# Patient Record
Sex: Female | Born: 1960
Health system: Southern US, Community
[De-identification: ages and names within clinical notes are randomized; demographics above are authoritative.]

## PROBLEM LIST (undated history)

## (undated) DIAGNOSIS — R32 Unspecified urinary incontinence: Secondary | ICD-10-CM

## (undated) DIAGNOSIS — R112 Nausea with vomiting, unspecified: Secondary | ICD-10-CM

## (undated) DIAGNOSIS — M1990 Unspecified osteoarthritis, unspecified site: Secondary | ICD-10-CM

## (undated) DIAGNOSIS — Z8042 Family history of malignant neoplasm of prostate: Secondary | ICD-10-CM

## (undated) DIAGNOSIS — G43909 Migraine, unspecified, not intractable, without status migrainosus: Secondary | ICD-10-CM

## (undated) DIAGNOSIS — D251 Intramural leiomyoma of uterus: Secondary | ICD-10-CM

## (undated) DIAGNOSIS — N939 Abnormal uterine and vaginal bleeding, unspecified: Secondary | ICD-10-CM

## (undated) DIAGNOSIS — E039 Hypothyroidism, unspecified: Secondary | ICD-10-CM

## (undated) DIAGNOSIS — Z803 Family history of malignant neoplasm of breast: Secondary | ICD-10-CM

## (undated) DIAGNOSIS — N840 Polyp of corpus uteri: Secondary | ICD-10-CM

## (undated) DIAGNOSIS — Z9889 Other specified postprocedural states: Secondary | ICD-10-CM

## (undated) DIAGNOSIS — Z973 Presence of spectacles and contact lenses: Secondary | ICD-10-CM

## (undated) DIAGNOSIS — M7500 Adhesive capsulitis of unspecified shoulder: Secondary | ICD-10-CM

## (undated) HISTORY — DX: Unspecified urinary incontinence: R32

## (undated) HISTORY — DX: Family history of malignant neoplasm of prostate: Z80.42

## (undated) HISTORY — PX: BREAST ENHANCEMENT SURGERY: SHX7

## (undated) HISTORY — PX: NASAL SEPTUM SURGERY: SHX37

## (undated) HISTORY — DX: Unspecified osteoarthritis, unspecified site: M19.90

## (undated) HISTORY — PX: WISDOM TOOTH EXTRACTION: SHX21

## (undated) HISTORY — DX: Adhesive capsulitis of unspecified shoulder: M75.00

## (undated) HISTORY — PX: FACIAL COSMETIC SURGERY: SHX629

## (undated) HISTORY — DX: Family history of malignant neoplasm of breast: Z80.3

## (undated) HISTORY — DX: Intramural leiomyoma of uterus: D25.1

## (undated) HISTORY — DX: Hypothyroidism, unspecified: E03.9

---

## 1999-05-15 ENCOUNTER — Other Ambulatory Visit: Admission: RE | Admit: 1999-05-15 | Discharge: 1999-05-15 | Payer: Self-pay | Admitting: Obstetrics & Gynecology

## 2000-06-02 ENCOUNTER — Other Ambulatory Visit: Admission: RE | Admit: 2000-06-02 | Discharge: 2000-06-02 | Payer: Self-pay | Admitting: Obstetrics and Gynecology

## 2001-06-03 ENCOUNTER — Other Ambulatory Visit: Admission: RE | Admit: 2001-06-03 | Discharge: 2001-06-03 | Payer: Self-pay | Admitting: Obstetrics and Gynecology

## 2001-11-17 ENCOUNTER — Other Ambulatory Visit: Admission: RE | Admit: 2001-11-17 | Discharge: 2001-11-17 | Payer: Self-pay | Admitting: Obstetrics and Gynecology

## 2002-06-22 ENCOUNTER — Other Ambulatory Visit: Admission: RE | Admit: 2002-06-22 | Discharge: 2002-06-22 | Payer: Self-pay | Admitting: Obstetrics and Gynecology

## 2003-10-04 ENCOUNTER — Other Ambulatory Visit: Admission: RE | Admit: 2003-10-04 | Discharge: 2003-10-04 | Payer: Self-pay | Admitting: Obstetrics and Gynecology

## 2005-03-20 ENCOUNTER — Other Ambulatory Visit: Admission: RE | Admit: 2005-03-20 | Discharge: 2005-03-20 | Payer: Self-pay | Admitting: Obstetrics and Gynecology

## 2006-08-29 ENCOUNTER — Other Ambulatory Visit: Admission: RE | Admit: 2006-08-29 | Discharge: 2006-08-29 | Payer: Self-pay | Admitting: Obstetrics and Gynecology

## 2007-09-04 ENCOUNTER — Other Ambulatory Visit: Admission: RE | Admit: 2007-09-04 | Discharge: 2007-09-04 | Payer: Self-pay | Admitting: Obstetrics and Gynecology

## 2007-10-26 ENCOUNTER — Other Ambulatory Visit: Admission: RE | Admit: 2007-10-26 | Discharge: 2007-10-26 | Payer: Self-pay | Admitting: Obstetrics and Gynecology

## 2008-02-24 ENCOUNTER — Other Ambulatory Visit: Admission: RE | Admit: 2008-02-24 | Discharge: 2008-02-24 | Payer: Self-pay | Admitting: Obstetrics and Gynecology

## 2008-05-31 ENCOUNTER — Other Ambulatory Visit: Admission: RE | Admit: 2008-05-31 | Discharge: 2008-05-31 | Payer: Self-pay | Admitting: Obstetrics and Gynecology

## 2011-01-25 LAB — HM PAP SMEAR

## 2011-02-07 ENCOUNTER — Encounter: Payer: Self-pay | Admitting: Internal Medicine

## 2011-03-13 ENCOUNTER — Ambulatory Visit: Payer: Self-pay | Admitting: Internal Medicine

## 2011-07-08 ENCOUNTER — Encounter: Payer: Self-pay | Admitting: Internal Medicine

## 2011-09-03 ENCOUNTER — Ambulatory Visit: Payer: Self-pay | Admitting: Internal Medicine

## 2011-09-16 ENCOUNTER — Telehealth: Payer: Self-pay | Admitting: Internal Medicine

## 2011-09-16 NOTE — Telephone Encounter (Signed)
Message copied by Arna Snipe on Mon Sep 16, 2011  8:51 AM ------      Message from: Richardson Chiquito      Created: Tue Sep 03, 2011  2:41 PM                   ----- Message -----         From: Hart Carwin, MD         Sent: 09/03/2011   2:38 PM           To: Richardson Chiquito, CMA            Please charge no show.      ----- Message -----         From: Richardson Chiquito, CMA         Sent: 09/03/2011   2:08 PM           To: Hart Carwin, MD            Dr Juanda Chance-      Patient no showed appointment with you on 09/03/11. Do you want to charge no show fee?

## 2011-09-18 ENCOUNTER — Encounter: Payer: Self-pay | Admitting: Internal Medicine

## 2011-11-26 ENCOUNTER — Other Ambulatory Visit: Payer: Managed Care, Other (non HMO)

## 2011-11-26 ENCOUNTER — Ambulatory Visit (INDEPENDENT_AMBULATORY_CARE_PROVIDER_SITE_OTHER): Payer: Managed Care, Other (non HMO) | Admitting: Internal Medicine

## 2011-11-26 ENCOUNTER — Encounter: Payer: Self-pay | Admitting: Internal Medicine

## 2011-11-26 VITALS — BP 100/60 | HR 60 | Ht 63.5 in | Wt 121.8 lb

## 2011-11-26 DIAGNOSIS — K5900 Constipation, unspecified: Secondary | ICD-10-CM

## 2011-11-26 DIAGNOSIS — R140 Abdominal distension (gaseous): Secondary | ICD-10-CM

## 2011-11-26 DIAGNOSIS — R141 Gas pain: Secondary | ICD-10-CM

## 2011-11-26 DIAGNOSIS — R143 Flatulence: Secondary | ICD-10-CM

## 2011-11-26 MED ORDER — POLYETHYLENE GLYCOL 3350 17 GM/SCOOP PO POWD: ORAL | Status: DC

## 2011-11-26 NOTE — Progress Notes (Signed)
Anna Young 08-02-1960 MRN [id number]        History of Present Illness:  This is a 51 year old white female with bloating, gas and constipation. She at one point did not have a bowel movement for 2 weeks. She used to be regular until she was diagnosed with the thyroid problems about 4 years ago. She is now on thyroid replacement. She is taking magnesium oxide and probiotics without much improvement in her bowel habits. He denies rectal bleeding. There is no family history of colon cancer or inflammatory bowel disease her diet does not consist of high fiber. She prefers protein. Her job is sedentary. Her weight has increased about 5 pounds . There is a positive family history of gallbladder disease in her mother who had a gallbladder removed   Past Medical History  Diagnosis Date  . Hypothyroidism    Past Surgical History  Procedure Date  . Mouth surgery     wisdom teeth  . Nasal septum surgery     reports that she has never smoked. She has never used smokeless tobacco. She reports that she drinks alcohol. She reports that she does not use illicit drugs. family history includes Colon polyps in her father and Diabetes in her mother.  There is no history of Colon cancer. No Known Allergies      Review of Systems: Denies heartburn dyspepsia dysphagia chest pain  The remainder of the 10 point ROS is negative except as outlined in H&P   Physical Exam: General appearance  Well developed, in no distress. Eyes- non icteric. HEENT nontraumatic, normocephalic. Mouth no lesions, tongue papillated, no cheilosis. Neck supple without adenopathy, thyroid not enlarged, no carotid bruits, no JVD. Lungs Clear to auscultation bilaterally. Cor normal S1, normal S2, regular rhythm, no murmur,  quiet precordium. Abdomen: Soft with increased tympany. Normoactive bowel sounds. No tenderness. Liver edge at costal margin. Lower abdomen unremarkable Rectal: Small amount of Hemoccult negative  stool Extremities no pedal edema. Skin no lesions. Neurological alert and oriented x 3. Psychological normal mood and affect.  Assessment and Plan:  Problem #1 Suspect chronic functional constipation due to slow transit time. We need to rule out redundant colon or partial colon obstruction. She is Hemoccult-negative. There has been no weight loss. We will need to increase the fiber intake in her diet and she should become more physically active to stimulate her peristalsis. She is euthyroid at this time. I will start her on MiraLax 17 g daily and she may be able to titrate the dose. We will also proceed with a colonoscopy because she is turning 51 this year and she will use the routine moviprep.   Problem #2 Positive family history of gallbladder disease. She has a lot of bloating and food intolerance. We will go ahead with an upper abdominal ultrasound.  11/26/2011 Lina Sar

## 2011-11-26 NOTE — Patient Instructions (Addendum)
You have been scheduled for an abdominal ultrasound at [facility] (1st floor of hospital) on 12/02/11 at 8:30 am. Please arrive 15 minutes prior to your appointment for registration. Make certain not to have anything to eat or drink 6 hours prior to your appointment. Should you need to reschedule your appointment, please contact radiology at [phone]. You have been scheduled for a colonoscopy with propofol. Please follow written instructions given to you at your visit today.  Please pick up your prep kit at the pharmacy within the next 1-3 days. If you use inhalers (even only as needed), please bring them with you on the day of your procedure. Your physician has requested that you go to the basement for the following lab work before leaving today: Celiac 10 panel We have sent the following medications to your pharmacy for you to pick up at your convenience: Miralax CC: Dr Bea Laura. Alessandra Bevels High Fiber Diet A high fiber diet changes your normal diet to include more whole grains, legumes, fruits, and vegetables. Changes in the diet involve replacing refined carbohydrates with unrefined foods. The calorie level of the diet is essentially unchanged. The Dietary Reference Intake (recommended amount) for adult males is 38 g per day. For adult females, it is 25 g per day. Pregnant and lactating women should consume 28 g of fiber per day. Fiber is the intact part of a plant that is not broken down during digestion. Functional fiber is fiber that has been isolated from the plant to provide a beneficial effect in the body. PURPOSE  Increase stool bulk.   Ease and regulate bowel movements.   Lower cholesterol.  INDICATIONS THAT YOU NEED MORE FIBER  Constipation and hemorrhoids.   Uncomplicated diverticulosis (intestine condition) and irritable bowel syndrome.   Weight management.   As a protective measure against hardening of the arteries (atherosclerosis), diabetes, and cancer.  NOTE OF  CAUTION If you have a digestive or bowel problem, ask your caregiver for advice before adding high fiber foods to your diet. Some of the following medical problems are such that a high fiber diet should not be used without consulting your caregiver:  Acute diverticulitis (intestine infection).   Partial small bowel obstructions.   Complicated diverticular disease involving bleeding, rupture (perforation), or abscess (boil, furuncle).   Presence of autonomic neuropathy (nerve damage) or gastric paresis (stomach cannot empty itself).  GUIDELINES FOR INCREASING FIBER  Start adding fiber to the diet slowly. A gradual increase of about 5 more grams (2 slices of whole-wheat bread, 2 servings of most fruits or vegetables, or 1 bowl of high fiber cereal) per day is best. Too rapid an increase in fiber may result in constipation, flatulence, and bloating.   Drink enough water and fluids to keep your urine clear or pale yellow. Water, juice, or caffeine-free drinks are recommended. Not drinking enough fluid may cause constipation.   Eat a variety of high fiber foods rather than one type of fiber.   Try to increase your intake of fiber through using high fiber foods rather than fiber pills or supplements that contain small amounts of fiber.   The goal is to change the types of food eaten. Do not supplement your present diet with high fiber foods, but replace foods in your present diet.  INCLUDE A VARIETY OF FIBER SOURCES  Replace refined and processed grains with whole grains, canned fruits with fresh fruits, and incorporate other fiber sources. White rice, white breads, and most bakery goods contain little or  no fiber.   Brown whole-grain rice, buckwheat oats, and many fruits and vegetables are all good sources of fiber. These include: broccoli, Brussels sprouts, cabbage, cauliflower, beets, sweet potatoes, white potatoes (skin on), carrots, tomatoes, eggplant, squash, berries, fresh fruits, and dried  fruits.   Cereals appear to be the richest source of fiber. Cereal fiber is found in whole grains and bran. Bran is the fiber-rich outer coat of cereal grain, which is largely removed in refining. In whole-grain cereals, the bran remains. In breakfast cereals, the largest amount of fiber is found in those with "bran" in their names. The fiber content is sometimes indicated on the label.   You may need to include additional fruits and vegetables each day.   In baking, for 1 cup white flour, you may use the following substitutions:   1 cup whole-wheat flour minus 2 tbs.    cup white flour plus  cup whole-wheat flour.  Document Released: 04/29/2005 Document Revised: 04/18/2011 Document Reviewed: 03/07/2009 [employer] Patient Information 2012 [employer], [place]. ______________________________________________________________________________________  Constipation in Adults Constipation is having fewer than 2 bowel movements per week. Usually, the stools are hard. As we grow older, constipation is more common. If you try to fix constipation with laxatives, the problem may get worse. This is because laxatives taken over a long period of time make the colon muscles weaker. A low-fiber diet, not taking in enough fluids, and taking some medicines may make these problems worse. MEDICATIONS THAT MAY CAUSE CONSTIPATION  Water pills (diuretics).   Calcium channel blockers (used to control blood pressure and for the heart).   Certain pain medicines (narcotics).   Anticholinergics.   Anti-inflammatory agents.   Antacids that contain aluminum.  DISEASES THAT CONTRIBUTE TO CONSTIPATION  Diabetes.   Parkinson's disease.   Dementia.   Stroke.   Depression.   Illnesses that cause problems with salt and water metabolism.  HOME CARE INSTRUCTIONS   Constipation is usually best cared for without medicines. Increasing dietary fiber and eating more fruits and vegetables is the best way to manage  constipation.   Slowly increase fiber intake to 25 to 38 grams per day. Whole grains, fruits, vegetables, and legumes are good sources of fiber. A dietitian can further help you incorporate high-fiber foods into your diet.   Drink enough water and fluids to keep your urine clear or pale yellow.   A fiber supplement may be added to your diet if you cannot get enough fiber from foods.   Increasing your activities also helps improve regularity.   Suppositories, as suggested by your caregiver, will also help. If you are using antacids, such as aluminum or calcium containing products, it will be helpful to switch to products containing magnesium if your caregiver says it is okay.   If you have been given a liquid injection (enema) today, this is only a temporary measure. It should not be relied on for treatment of longstanding (chronic) constipation.   Stronger measures, such as magnesium sulfate, should be avoided if possible. This may cause uncontrollable diarrhea. Using magnesium sulfate may not allow you time to make it to the bathroom.  SEEK IMMEDIATE MEDICAL CARE IF:   There is bright red blood in the stool.   The constipation stays for more than 4 days.   There is belly (abdominal) or rectal pain.   You do not seem to be getting better.   You have any questions or concerns.  MAKE SURE YOU:   Understand these instructions.  Will watch your condition.   Will get help right away if you are not doing well or get worse.  Document Released: 01/26/2004 Document Revised: 04/18/2011 Document Reviewed: 04/02/2011 [facility] Patient Information 2012 [place], [place].

## 2011-11-27 LAB — CELIAC PANEL 10
Endomysial Screen: NEGATIVE
Gliadin IgG: 3.7 U/mL (ref <20)
Tissue Transglutaminase Ab, IgA: 3.2 U/mL (ref <20)

## 2011-11-28 ENCOUNTER — Telehealth: Payer: Self-pay | Admitting: Internal Medicine

## 2011-11-28 ENCOUNTER — Telehealth: Payer: Self-pay | Admitting: *Deleted

## 2011-11-28 NOTE — Telephone Encounter (Signed)
Spoke with patient and she had no prep at [facility].   I called the prep in by VM at pharmacy since they are closed at this time.    Patient is aware that it was called in and thanked me.

## 2011-11-29 ENCOUNTER — Encounter: Payer: Self-pay | Admitting: Internal Medicine

## 2011-11-29 ENCOUNTER — Ambulatory Visit (AMBULATORY_SURGERY_CENTER): Payer: Managed Care, Other (non HMO) | Admitting: Internal Medicine

## 2011-11-29 VITALS — BP 93/51 | HR 71 | Temp 97.7°F | Resp 20 | Ht 63.5 in | Wt 121.0 lb

## 2011-11-29 DIAGNOSIS — Z1211 Encounter for screening for malignant neoplasm of colon: Secondary | ICD-10-CM

## 2011-11-29 DIAGNOSIS — D126 Benign neoplasm of colon, unspecified: Secondary | ICD-10-CM

## 2011-11-29 DIAGNOSIS — R141 Gas pain: Secondary | ICD-10-CM

## 2011-11-29 MED ORDER — SODIUM CHLORIDE 0.9 % IV SOLN: 500.0000 mL | INTRAVENOUS | Status: DC

## 2011-11-29 MED ORDER — HYOSCYAMINE SULFATE 0.125 MG SL SUBL: 0.1250 mg | SUBLINGUAL_TABLET | SUBLINGUAL | Status: DC | PRN

## 2011-11-29 NOTE — Progress Notes (Signed)
Patient did not experience any of the following events: a burn prior to discharge; a fall within the facility; wrong site/side/patient/procedure/implant event; or a hospital transfer or hospital admission upon discharge from the facility. (G8907) Patient did not have preoperative order for IV antibiotic SSI prophylaxis. (G8918)  

## 2011-11-29 NOTE — Op Note (Signed)
[facility] [address] [place], [place]  [address]  COLONOSCOPY PROCEDURE REPORT  PATIENT:  Anna Young, Anna Young  MR#:  [id number] BIRTHDATE:  [birth year], 50 yrs. old  GENDER:  female ENDOSCOPIST:  Hedwig Morton. Juanda Chance, MD REF. BY:  Mia Creek, M.D. PROCEDURE DATE:  11/29/2011 PROCEDURE:  Colonoscopy with snare polypectomy ASA CLASS:  Class I INDICATIONS:  colorectal cancer screening, average risk, change in bowel habits MEDICATIONS:   MAC sedation, administered by CRNA, propofol (Diprivan) 200 mg  DESCRIPTION OF PROCEDURE:   After the risks and benefits and of the procedure were explained, informed consent was obtained. Digital rectal exam was performed and revealed no rectal masses. The LB [id number] endoscope was introduced through the anus and advanced to the cecum, which was identified by both the appendix and ileocecal valve.  The quality of the prep was good, using MoviPrep.  The instrument was then slowly withdrawn as the colon was fully examined. <<PROCEDUREIMAGES>>  FINDINGS:  A sessile polyp was found. 6 mm sessile polyp at 20 cm Polyp was snared without cautery. Retrieval was successful (see image5 and image4). snare polyp  This was otherwise a normal examination of the colon (see image6, image3, image2, and image1). Retroflexed views in the rectum revealed no abnormalities.    The scope was then withdrawn from the patient and the procedure completed.  COMPLICATIONS:  None ENDOSCOPIC IMPRESSION: 1) Sessile polyp 2) Otherwise normal examination RECOMMENDATIONS: 1) Await pathology results 2) High fiber diet. abdominal ultrasound pending  REPEAT EXAM:  In 10 year(s) for.  if polyp is adenomatous,then 5 year recall  ______________________________ Hedwig Morton. Juanda Chance, MD  CC:  n. eSIGNED:   Hedwig Morton. Anmol Paschen at 11/29/2011 01:02 PM  Gigi Gin, [phone]

## 2011-11-29 NOTE — Patient Instructions (Addendum)
YOU HAD AN ENDOSCOPIC PROCEDURE TODAY AT THE [facility]: Refer to the procedure report that was given to you for any specific questions about what was found during the examination.  If the procedure report does not answer your questions, please call your gastroenterologist to clarify.  If you requested that your care partner not be given the details of your procedure findings, then the procedure report has been included in a sealed envelope for you to review at your convenience later.  YOU SHOULD EXPECT: Some feelings of bloating in the abdomen. Passage of more gas than usual.  Walking can help get rid of the air that was put into your GI tract during the procedure and reduce the bloating. If you had a lower endoscopy (such as a colonoscopy or flexible sigmoidoscopy) you may notice spotting of blood in your stool or on the toilet paper. If you underwent a bowel prep for your procedure, then you may not have a normal bowel movement for a few days.  DIET: Your first meal following the procedure should be a light meal and then it is ok to progress to your normal diet.  A half-sandwich or bowl of soup is an example of a good first meal.  Heavy or fried foods are harder to digest and may make you feel nauseous or bloated.  Likewise meals heavy in dairy and vegetables can cause extra gas to form and this can also increase the bloating.  Drink plenty of fluids but you should avoid alcoholic beverages for 24 hours.  ACTIVITY: Your care partner should take you home directly after the procedure.  You should plan to take it easy, moving slowly for the rest of the day.  You can resume normal activity the day after the procedure however you should NOT DRIVE or use heavy machinery for 24 hours (because of the sedation medicines used during the test).    SYMPTOMS TO REPORT IMMEDIATELY: A gastroenterologist can be reached at any hour.  During normal business hours, 8:30 AM to 5:00 PM Monday through Friday,  call [phone].  After hours and on weekends, please call the GI answering service at [phone] who will take a message and have the physician on call contact you.   Following lower endoscopy (colonoscopy or flexible sigmoidoscopy):  Excessive amounts of blood in the stool  Significant tenderness or worsening of abdominal pains  Swelling of the abdomen that is new, acute  Fever of 100F or higher   FOLLOW UP: If any biopsies were taken you will be contacted by phone or by letter within the next 1-3 weeks.  Call your gastroenterologist if you have not heard about the biopsies in 3 weeks.  Our staff will call the home number listed on your records the next business day following your procedure to check on you and address any questions or concerns that you may have at that time regarding the information given to you following your procedure. This is a courtesy call and so if there is no answer at the home number and we have not heard from you through the emergency physician on call, we will assume that you have returned to your regular daily activities without incident.  SIGNATURES/CONFIDENTIALITY: You and/or your care partner have signed paperwork which will be entered into your electronic medical record.  These signatures attest to the fact that that the information above on your After Visit Summary has been reviewed and is understood.  Full responsibility of the confidentiality of   this discharge information lies with you and/or your care-partner.   Resume medications. Information given on polyps and high fiber diet with discharge instructions. 

## 2011-12-02 ENCOUNTER — Telehealth: Payer: Self-pay | Admitting: *Deleted

## 2011-12-02 ENCOUNTER — Ambulatory Visit (HOSPITAL_COMMUNITY)
Admission: RE | Admit: 2011-12-02 | Discharge: 2011-12-02 | Disposition: A | Discharge status: Home / Self Care | Payer: Managed Care, Other (non HMO) | Source: Ambulatory Visit | Attending: Internal Medicine | Admitting: Internal Medicine

## 2011-12-02 DIAGNOSIS — R141 Gas pain: Secondary | ICD-10-CM | POA: Insufficient documentation

## 2011-12-02 DIAGNOSIS — R109 Unspecified abdominal pain: Secondary | ICD-10-CM | POA: Insufficient documentation

## 2011-12-02 DIAGNOSIS — R143 Flatulence: Secondary | ICD-10-CM | POA: Insufficient documentation

## 2011-12-02 DIAGNOSIS — R140 Abdominal distension (gaseous): Secondary | ICD-10-CM

## 2011-12-02 DIAGNOSIS — R142 Eructation: Secondary | ICD-10-CM | POA: Insufficient documentation

## 2011-12-02 NOTE — Telephone Encounter (Signed)
  Follow up Call-  Call back number 11/29/2011  Post procedure Call Back phone  # [phone]  Permission to leave phone message Yes     Patient questions:  Do you have a fever, pain , or abdominal swelling? no Pain Score  0 *  Have you tolerated food without any problems? yes  Have you been able to return to your normal activities? yes  Do you have any questions about your discharge instructions: Diet   no Medications  no Follow up visit  no  Do you have questions or concerns about your Care? no  Actions: * If pain score is 4 or above: No action needed, pain <4.

## 2011-12-03 ENCOUNTER — Encounter: Payer: Self-pay | Admitting: Internal Medicine

## 2011-12-06 LAB — HM MAMMOGRAPHY

## 2011-12-12 ENCOUNTER — Other Ambulatory Visit: Payer: Self-pay | Admitting: Internal Medicine

## 2011-12-12 MED ORDER — LUBIPROSTONE 24 MCG PO CAPS: 24.0000 ug | ORAL_CAPSULE | Freq: Every day | ORAL | Status: DC

## 2011-12-12 NOTE — Telephone Encounter (Signed)
Dr Juanda Chance- (please see note below).... Pt prefers a "pill" to take for constipation instead of Miralax powder. Would you like me to send her Amitiza or Linzess?

## 2011-12-12 NOTE — Telephone Encounter (Signed)
Would try Amitiza 24ug po bid, #30, if too expensive, purchase OTC Mad Oxide 500mg  2 po qd and/or stool softener which may be taken in addition to the laxative if necessary.

## 2011-12-12 NOTE — Telephone Encounter (Signed)
Patient verbalizes understanding of Dr Regino Schultze recommendations.

## 2011-12-26 NOTE — Telephone Encounter (Signed)
See previous message

## 2012-02-01 ENCOUNTER — Other Ambulatory Visit: Payer: Self-pay | Admitting: Internal Medicine

## 2012-05-13 DIAGNOSIS — D251 Intramural leiomyoma of uterus: Secondary | ICD-10-CM

## 2012-05-13 HISTORY — DX: Intramural leiomyoma of uterus: D25.1

## 2013-02-10 ENCOUNTER — Ambulatory Visit: Payer: Self-pay | Admitting: Obstetrics and Gynecology

## 2013-02-10 ENCOUNTER — Ambulatory Visit (INDEPENDENT_AMBULATORY_CARE_PROVIDER_SITE_OTHER): Payer: Managed Care, Other (non HMO) | Admitting: Obstetrics and Gynecology

## 2013-02-10 ENCOUNTER — Telehealth: Payer: Self-pay | Admitting: Obstetrics and Gynecology

## 2013-02-10 ENCOUNTER — Encounter: Payer: Self-pay | Admitting: Obstetrics and Gynecology

## 2013-02-10 VITALS — BP 130/64 | HR 76 | Ht 63.0 in | Wt 114.0 lb

## 2013-02-10 DIAGNOSIS — Z01419 Encounter for gynecological examination (general) (routine) without abnormal findings: Secondary | ICD-10-CM

## 2013-02-10 DIAGNOSIS — D259 Leiomyoma of uterus, unspecified: Secondary | ICD-10-CM

## 2013-02-10 DIAGNOSIS — K5900 Constipation, unspecified: Secondary | ICD-10-CM

## 2013-02-10 DIAGNOSIS — R32 Unspecified urinary incontinence: Secondary | ICD-10-CM

## 2013-02-10 DIAGNOSIS — D219 Benign neoplasm of connective and other soft tissue, unspecified: Secondary | ICD-10-CM | POA: Insufficient documentation

## 2013-02-10 DIAGNOSIS — N3946 Mixed incontinence: Secondary | ICD-10-CM | POA: Insufficient documentation

## 2013-02-10 NOTE — Telephone Encounter (Signed)
LMTCB to discuss ins benefits and schedule PUS.  °

## 2013-02-10 NOTE — Patient Instructions (Signed)
EXERCISE AND DIET:  We recommended that you start or continue a regular exercise program for good health. Regular exercise means any activity that makes your heart beat faster and makes you sweat.  We recommend exercising at least 30 minutes per day at least 3 days a week, preferably 4 or 5.  We also recommend a diet low in fat and sugar.  Inactivity, poor dietary choices and obesity can cause diabetes, heart attack, stroke, and kidney damage, among others.    ALCOHOL AND SMOKING:  Women should limit their alcohol intake to no more than 7 drinks/beers/glasses of wine (combined, not each!) per week. Moderation of alcohol intake to this level decreases your risk of breast cancer and liver damage. And of course, no recreational drugs are part of a healthy lifestyle.  And absolutely no smoking or even second hand smoke. Most people know smoking can cause heart and lung diseases, but did you know it also contributes to weakening of your bones? Aging of your skin?  Yellowing of your teeth and nails?  CALCIUM AND VITAMIN D:  Adequate intake of calcium and Vitamin D are recommended.  The recommendations for exact amounts of these supplements seem to change often, but generally speaking 600 mg of calcium (either carbonate or citrate) and 800 units of Vitamin D per day seems prudent. Certain women may benefit from higher intake of Vitamin D.  If you are among these women, your doctor will have told you during your visit.    PAP SMEARS:  Pap smears, to check for cervical cancer or precancers,  have traditionally been done yearly, although recent scientific advances have shown that most women can have pap smears less often.  However, every woman still should have a physical exam from her gynecologist every year. It will include a breast check, inspection of the vulva and vagina to check for abnormal growths or skin changes, a visual exam of the cervix, and then an exam to evaluate the size and shape of the uterus and  ovaries.  And after 51 years of age, a rectal exam is indicated to check for rectal cancers. We will also provide age appropriate advice regarding health maintenance, like when you should have certain vaccines, screening for sexually transmitted diseases, bone density testing, colonoscopy, mammograms, etc.   MAMMOGRAMS:  All women over 40 years old should have a yearly mammogram. Many facilities now offer a "3D" mammogram, which may cost around $50 extra out of pocket. If possible,  we recommend you accept the option to have the 3D mammogram performed.  It both reduces the number of women who will be called back for extra views which then turn out to be normal, and it is better than the routine mammogram at detecting truly abnormal areas.    COLONOSCOPY:  Colonoscopy to screen for colon cancer is recommended for all women at age 50.  We know, you hate the idea of the prep.  We agree, BUT, having colon cancer and not knowing it is worse!!  Colon cancer so often starts as a polyp that can be seen and removed at colonscopy, which can quite literally save your life!  And if your first colonoscopy is normal and you have no family history of colon cancer, most women don't have to have it again for 10 years.  Once every ten years, you can do something that may end up saving your life, right?  We will be happy to help you get it scheduled when you are ready.    Be sure to check your insurance coverage so you understand how much it will cost.  It may be covered as a preventative service at no cost, but you should check your particular policy.     Progesterone capsules What is this medicine? PROGESTERONE (proe JES ter one) is a female hormone. This medicine is used to prevent the overgrowth of the lining of the uterus in women who are taking estrogens for the symptoms of menopause. It is also used to treat secondary amenorrhea. This is when a woman stops getting menstrual periods due to low levels of  progesterone. This medicine may be used for other purposes; ask your health care provider or pharmacist if you have questions. What should I tell my health care provider before I take this medicine? They need to know if you have any of these conditions: -autoimmune disease like systemic lupus erythematosus (SLE) -blood vessel disease, blood clotting disorder, or suffered a stroke -breast, cervical or vaginal cancer -dementia -diabetes -kidney or liver disease -heart disease, high blood pressure or recent heart attack -high blood lipids or cholesterol -hysterectomy -recent miscarriage -tobacco smoker -vaginal bleeding -an unusual or allergic reaction to progesterone, peanuts, other medicines, foods, dyes, or preservatives -pregnant or trying to get pregnant -breast-feeding How should I use this medicine? Take this medicine by mouth with a glass of water. Follow the directions on the prescription label. Take your doses at regular intervals. Do not take your medicine more often than directed. Talk to your pediatrician regarding the use of this medicine in children. Special care may be needed. A patient package insert for the product will be given with each prescription and refill. Read this sheet carefully each time. The sheet may change frequently. Overdosage: If you think you have taken too much of this medicine contact a poison control center or emergency room at once. NOTE: This medicine is only for you. Do not share this medicine with others. What if I miss a dose? If you miss a dose, take it as soon as you can. If it is almost time for your next dose, take only that dose. Do not take double or extra doses. What may interact with this medicine? Do not take this medicine with any of the following medications: -bosentan This medicine may also interact with the following medications: -barbiturate medicines for sleep or  seizures -bexarotene -carbamazepine -ethotoin -ketoconazole -phenytoin -rifampin This list may not describe all possible interactions. Give your health care provider a list of all the medicines, herbs, non-prescription drugs, or dietary supplements you use. Also tell them if you smoke, drink alcohol, or use illegal drugs. Some items may interact with your medicine. What should I watch for while using this medicine? Visit your doctor or health care professional for regular checks on your progress. This medicine can cause swelling, tenderness, or bleeding of the gums. Be careful when brushing and flossing teeth. See your dentist regularly for routine dental care. You may get drowsy or dizzy. Do not drive, use machinery, or do anything that needs mental alertness until you know how this drug affects you. Do not stand or sit up quickly, especially if you are an older patient. This reduces the risk of dizzy or fainting spells. What side effects may I notice from receiving this medicine? Side effects that you should report to your doctor or health care professional as soon as possible: -allergic reactions like skin rash, itching or hives, swelling of the face, lips, or tongue -breast tissue changes or discharge -  changes in vaginal bleeding during your period or between your periods -depression -muscle or bone pain -numbness or pain in the arm or leg -pain in the chest, groin or leg -seizures or tremors -severe headache -stomach pain -sudden shortness of breath -unusually weak or tired -vision or speech problems -yellowing of skin or eyes Side effects that usually do not require medical attention (report to your doctor or health care professional if they continue or are bothersome): -acne -fluid retention and swelling -increased in appetite -mood changes, anxiety, depression, frustration, anger, or emotional outbursts -nausea, vomiting -sweating or hot flashes This list may not describe  all possible side effects. Call your doctor for medical advice about side effects. You may report side effects to FDA at 1-800-FDA-1088. Where should I keep my medicine? Keep out of the reach of children. Store at room temperature between 15 and 30 degrees C (59 and 86 degrees F). Protect from light. Keep container tightly closed. Throw away any unused medicine after the expiration date. NOTE: This sheet is a summary. It may not cover all possible information. If you have questions about this medicine, talk to your doctor, pharmacist, or health care provider.  2012, [employer]vier/[employer] Standard. (04/14/2008 11:43:48 AM)

## 2013-02-10 NOTE — Progress Notes (Addendum)
Patient ID: Anna Young, female   DOB: April 19, 1961, 52 y.o.   MRN: [id number] GYNECOLOGY VISIT  PCP:  Allena Napoleon, MD  Referring provider:   HPI: 52 y.o.   Married  Caucasian  female   [phone] with Patient's last menstrual period was 01/20/2013.   here for   AEX.  Menses every month, very light.  Menses can obe a week early or late.   crampy day one.  No intermenstrual bleeding.  No hot flashes or night sweats.  Takes progesterone orally daily - through Dr. Alessandra Bevels and [facility].  Not taking estrogen.   Known uterine fibroids on paper chart review.  Had ultrasound on 10/06/07 for AGUS pap and was noted to have three intramural fibroids - 14 x 6 mm, 9 x 11 mm, and 7 mm.  Normal ovaries.   Had some asymmetrical thickening of the endometrium at that time and had an endometrial biopsy showing degenerating secretory endometrium.  Has constipation issues ever since diagnosed with hypothyroidism. Takes Ex-lax once a week. Takes probiotics.    Urinary incontinence with cough and sneeze.  No pad use.  Does some Kegels.  Hgb:  PCP Urine:  none  GYNECOLOGIC HISTORY: Patient's last menstrual period was 01/20/2013. Sexually active:  yes Partner preference: female Contraception:   vasectomy Menopausal hormone therapy: no DES exposure:   no Blood transfusions:   no Sexually transmitted diseases: no   GYN Procedures: AGUS pap 4/09 - Endometrial biopsy 5/09--neg, colposcopy 6/09--CIN I .  Mammogram:  12-06-11 wnl: [facility].  Has bilateral breast augmentation with saline implants.           Pap:  01-25-11 wnl:no HR HPV History of abnormal pap smear:  4/09 atypical glandular cells--negative endometrial bx and CIN I on colposcopy.  Paps normal since.   OB History   Grav Para Term Preterm Abortions TAB SAB Ect Mult Living   3 3 3       3        LIFESTYLE: Exercise:   no         Tobacco:   no Alcohol:      socially Drug use:   no  OTHER HEALTH MAINTENANCE: Tetanus/TDap:   PCP Gardisil:  NA Influenza:   never Zostavax:  NA  Bone density:   never Colonoscopy:   11/2011 Dr. Verlee Monte Brodie:1 polyp.  Next colonoscopy 11/2016.  Cholesterol check:  wnl with PCP  Family History  Problem Relation Age of Onset  . Colon polyps Father   . Diabetes Mother     diet controlled  . Colon cancer Neg Hx   . Stroke Maternal Grandfather     There are no active problems to display for this patient.  Past Medical History  Diagnosis Date  . Hypothyroidism   . Urinary incontinence     especially with coughing and sneezing    Past Surgical History  Procedure Laterality Date  . Mouth surgery      wisdom teeth  . Nasal septum surgery    . Breast enhancement surgery    . Facial cosmetic surgery      ALLERGIES: Review of patient's allergies indicates no known allergies.  Current Outpatient Prescriptions  Medication Sig Dispense Refill  . levothyroxine (SYNTHROID, LEVOTHROID) 88 MCG tablet Take 88 mcg by mouth daily.      Marland Kitchen liothyronine (CYTOMEL) 5 MCG tablet Take 5 mcg by mouth daily.      . Misc Natural Products (COLON CLEANSE PO) Take 1 capsule by mouth  daily.      . Multiple Vitamin (MULTI-VITAMINS PO) Take by mouth daily.      . NONFORMULARY OR COMPOUNDED ITEM Testosterone and Progesterone compounded at [facility], 1/2 twice a day, disolves in cheek      . OVER THE COUNTER MEDICATION Vitamin D  10,000 every other day      . OVER THE COUNTER MEDICATION OTC enzymes -use as needed      . Probiotic Product (PROBIOTIC PO) Take by mouth daily.      Marland Kitchen VITAMIN A PO Take by mouth daily.       No current facility-administered medications for this visit.     ROS:  Pertinent items are noted in HPI.  SOCIAL HISTORY:  Realtor.  Daughter marrying this weekend.   PHYSICAL EXAMINATION:    BP 130/64  Pulse 76  Ht 5\' 3"  (1.6 m)  Wt 114 lb (51.71 kg)  BMI 20.2 kg/m2  LMP 01/20/2013   Wt Readings from Last 3 Encounters:  02/10/13 114 lb (51.71 kg)  11/29/11  121 lb (54.885 kg)  11/26/11 121 lb 12.8 oz (55.248 kg)     Ht Readings from Last 3 Encounters:  02/10/13 5\' 3"  (1.6 m)  11/29/11 5' 3.5" (1.613 m)  11/26/11 5' 3.5" (1.613 m)    General appearance: alert, cooperative and appears stated age Head: Normocephalic, without obvious abnormality, atraumatic Neck: no adenopathy, supple, symmetrical, trachea midline and thyroid not enlarged, symmetric, no tenderness/mass/nodules Lungs: clear to auscultation bilaterally Breasts: Inspection negative, No nipple retraction or dimpling, No nipple discharge or bleeding, No axillary or supraclavicular adenopathy, Normal to palpation without dominant masses Heart: regular rate and rhythm Abdomen: soft, non-tender; no masses,  no organomegaly Extremities: extremities normal, atraumatic, no cyanosis or edema Skin: Skin color, texture, turgor normal. No rashes or lesions Lymph nodes: Cervical, supraclavicular, and axillary nodes normal. No abnormal inguinal nodes palpated Neurologic: Grossly normal  Pelvic: External genitalia:  no lesions              Urethra:  normal appearing urethra with no masses, tenderness or lesions              Bartholins and Skenes: normal                 Vagina: normal appearing vagina with normal color and discharge, no lesions.  Good Kegel but not sustained.               Cervix: normal appearance              Pap and high risk HPV testing done: yes.            Bimanual Exam:  Uterus:   Anteverted with right uterine fundal fibroid 2 cm,  nontender                                      Adnexa: normal adnexa in size, nontender and no masses                                      Rectovaginal: Confirms                                      Anus:  normal  sphincter tone, no lesions  ASSESSMENT  Uterine fibroids.  Seem to be larger than previously documented. Stress incontinence.   Chronic constipation. On progesterone supplementation.   PLAN  Mammogram.  Patient will  [facility].  Pap smear and high risk HPV testing Counseled on  Fibroids, urinary incontinence.  ACOG handouts. Return for pelvic ultrasound.  I discussed increasing fiber through dietary changes, Metamucil, increased water, and increased activity to improve constipation. I discussed possible role of progesterone supplementation on constipation and fibroid growth stimulation.    Return annually or prn   An After Visit Summary was printed and given to the patient.

## 2013-02-12 LAB — IPS PAP TEST WITH HPV

## 2013-02-17 ENCOUNTER — Telehealth: Payer: Self-pay | Admitting: Obstetrics and Gynecology

## 2013-02-17 NOTE — Telephone Encounter (Signed)
LMTCB to discuss ins benefits and schedule PUS.  °

## 2013-02-19 NOTE — Telephone Encounter (Signed)
Patient is ready to schedule her PUS appointment.  °

## 2013-02-25 ENCOUNTER — Telehealth: Payer: Self-pay | Admitting: Obstetrics and Gynecology

## 2013-02-25 ENCOUNTER — Other Ambulatory Visit: Payer: Managed Care, Other (non HMO) | Admitting: Obstetrics and Gynecology

## 2013-02-25 ENCOUNTER — Other Ambulatory Visit: Payer: Managed Care, Other (non HMO)

## 2013-02-25 NOTE — Telephone Encounter (Signed)
LVM (both numbers) advising appointment time changed from 10 to 1030. Asked patient to call and confirm she is okay with this.

## 2013-02-25 NOTE — Telephone Encounter (Signed)
Patient called back and confirmed that it was ok.

## 2013-03-04 ENCOUNTER — Ambulatory Visit (INDEPENDENT_AMBULATORY_CARE_PROVIDER_SITE_OTHER): Payer: Managed Care, Other (non HMO)

## 2013-03-04 ENCOUNTER — Ambulatory Visit (INDEPENDENT_AMBULATORY_CARE_PROVIDER_SITE_OTHER): Payer: Managed Care, Other (non HMO) | Admitting: Obstetrics and Gynecology

## 2013-03-04 ENCOUNTER — Other Ambulatory Visit: Payer: Managed Care, Other (non HMO)

## 2013-03-04 ENCOUNTER — Encounter: Payer: Self-pay | Admitting: Obstetrics and Gynecology

## 2013-03-04 ENCOUNTER — Other Ambulatory Visit: Payer: Managed Care, Other (non HMO) | Admitting: Obstetrics and Gynecology

## 2013-03-04 VITALS — BP 92/60 | HR 68 | Resp 16 | Ht 63.0 in | Wt 116.0 lb

## 2013-03-04 DIAGNOSIS — D259 Leiomyoma of uterus, unspecified: Secondary | ICD-10-CM

## 2013-03-04 DIAGNOSIS — D251 Intramural leiomyoma of uterus: Secondary | ICD-10-CM

## 2013-03-04 DIAGNOSIS — D219 Benign neoplasm of connective and other soft tissue, unspecified: Secondary | ICD-10-CM

## 2013-03-04 NOTE — Progress Notes (Signed)
Subjective  Patient here for pelvic ultrasound to evaluate fibroids palpated on routine gyn exam this month.  Last ultrasound in 2009 documented 3 intramural fibroids 14 mm, 11 mm, and 7 mm in largest diameters.  Ovaries were normal then.  Patient is taking cyclic progesterone and feels better doing this than not.  Not taking any estrogen or testosterone.  Took testosterone in the past.   Objective  See ultrasound below - 4 intramural fibroids with largest diameters of 1.6 cm, 2.3 cm, 1.1 cm, and 1.3 cm.  EMS 4.32 mm.  Normal right ovary and left corpus luteum cyst 1.9 mm.     Assessment  Uterine fibroids - slight increase in sizes and increase in number from 3 - 4. On cyclic progesterone therapy.  Plan  Observational management. OK to continue with cyclic progesterone. Discussed the life history of fibroids with patient.   Discussed signs and symptoms to watch for - increased bleeding, pressure, urinary urgency. Has mammogram scheduled. Follow up next year for routine annual exam and prn.

## 2013-03-04 NOTE — Patient Instructions (Signed)
Call if you develop signs of increased bleeding, pelvic pressure, or urinary control problems.

## 2014-01-05 ENCOUNTER — Encounter: Payer: Self-pay | Admitting: Obstetrics and Gynecology

## 2014-02-11 ENCOUNTER — Ambulatory Visit: Payer: Managed Care, Other (non HMO) | Admitting: Obstetrics and Gynecology

## 2014-02-11 ENCOUNTER — Encounter: Payer: Self-pay | Admitting: Obstetrics and Gynecology

## 2014-02-11 ENCOUNTER — Ambulatory Visit (INDEPENDENT_AMBULATORY_CARE_PROVIDER_SITE_OTHER): Payer: BC Managed Care – PPO | Admitting: Obstetrics and Gynecology

## 2014-02-11 VITALS — BP 110/66 | HR 76 | Ht 63.25 in | Wt 117.0 lb

## 2014-02-11 DIAGNOSIS — Z0000 Encounter for general adult medical examination without abnormal findings: Secondary | ICD-10-CM

## 2014-02-11 DIAGNOSIS — Z01419 Encounter for gynecological examination (general) (routine) without abnormal findings: Secondary | ICD-10-CM

## 2014-02-11 LAB — POCT URINALYSIS DIPSTICK
BILIRUBIN UA: NEGATIVE
GLUCOSE UA: NEGATIVE
KETONES UA: NEGATIVE
Leukocytes, UA: NEGATIVE
NITRITE UA: NEGATIVE
Protein, UA: NEGATIVE
RBC UA: NEGATIVE
Urobilinogen, UA: NEGATIVE
pH, UA: 7

## 2014-02-11 NOTE — Patient Instructions (Signed)

## 2014-02-11 NOTE — Progress Notes (Signed)
Patient ID: Anna Young, female   DOB: [birth year], 53 y.o.   MRN: [id number] 52 y.o. [id number] MarriedCaucasianF here for annual exam.   No concerns. Menses back to regular again.  Menses can range every 3 weeks or more.  No prolonged bleeding.  No hot flashes. On progesterone only.  No estrogen or testosterone.   Sees Dr. Sharol Roussel for estrogen levels.  Is lactose and gluten free.  Negative for Celiac Disease but feels better being gluten free.   Some leakage of urine when runs or sneezes.  No LMP recorded.      LMP 02/07/14.    Sexually active: Yes.    The current method of family planning is vasectomy.    Exercising: yes, walking every night with new puppy Smoker:  no  Health Maintenance: Pap:  02/10/13, KGU:[id number] HR HPV History of abnormal Pap:  4/09 atypical glandular cells--negative endometrial bx and CIN I on colposcopy. Paps normal since. MMG:  03/10/13, [facility], 3D, Bi-Rads 2: benign findings.  Will schedule.  Colonoscopy:  11/2011 Dr. Sydell Axon Brodie:1 polyp. Next colonoscopy 11/2016. BMD:   never TDaP:  PCP Screening Labs: PCP, Hb today: PCP, Urine today: negative    reports that she has never smoked. She has never used smokeless tobacco. She reports that she drinks about .5 ounces of alcohol per week. She reports that she does not use illicit drugs.  Past Medical History  Diagnosis Date  . Hypothyroidism   . Urinary incontinence     especially with coughing and sneezing    Past Surgical History  Procedure Laterality Date  . Mouth surgery      wisdom teeth  . Nasal septum surgery    . Breast enhancement surgery    . Facial cosmetic surgery      Current Outpatient Prescriptions  Medication Sig Dispense Refill  . levothyroxine (SYNTHROID, LEVOTHROID) 88 MCG tablet Take 88 mcg by mouth daily.      Marland Kitchen liothyronine (CYTOMEL) 5 MCG tablet Take 5 mcg by mouth daily.      . Misc Natural Products (COLON CLEANSE PO) Take 1 capsule by mouth daily.      . Multiple Vitamin  (MULTI-VITAMINS PO) Take by mouth daily.      . NONFORMULARY OR COMPOUNDED ITEM Testosterone and Progesterone compounded at [facility], 1/2 twice a day, disolves in cheek      . OVER THE COUNTER MEDICATION Vitamin D  10,000 every other day      . OVER THE COUNTER MEDICATION OTC enzymes -use as needed      . Probiotic Product (PROBIOTIC PO) Take by mouth daily.      Marland Kitchen VITAMIN A PO Take by mouth daily.       No current facility-administered medications for this visit.    Family History  Problem Relation Age of Onset  . Colon polyps Father   . Diabetes Mother     diet controlled  . Colon cancer Neg Hx   . Stroke Maternal Grandfather     ROS:  Pertinent items are noted in HPI.  Otherwise, a comprehensive ROS was negative.  Exam:   There were no vitals taken for this visit.   BP 110/66, Pulse 76 Weight 117 pounds      Ht Readings from Last 3 Encounters:  03/04/13 5\' 3"  (1.6 m)  02/10/13 5\' 3"  (1.6 m)  11/29/11 5' 3.5" (1.613 m)    General appearance: alert, cooperative and appears stated age Head: Normocephalic, without obvious abnormality, atraumatic  Neck: no adenopathy, supple, symmetrical, trachea midline and thyroid normal to inspection and palpation Lungs: clear to auscultation bilaterally Breasts: normal appearance, no masses or tenderness,  Bilateral augmentation. Heart: regular rate and rhythm Abdomen: soft, non-tender; bowel sounds normal; no masses,  no organomegaly Extremities: extremities normal, atraumatic, no cyanosis or edema Skin: Skin color, texture, turgor normal. No rashes or lesions Lymph nodes: Cervical, supraclavicular, and axillary nodes normal. No abnormal inguinal nodes palpated Neurologic: Grossly normal   Pelvic: External genitalia:  no lesions              Urethra:  normal appearing urethra with no masses, tenderness or lesions              Bartholins and Skenes: normal                 Vagina: normal appearing vagina with normal color  and discharge, no lesions.  Menstrual flow noted.              Cervix: anteverted              Pap taken: Yes.   Bimanual Exam:  Uterus:  normal size, contour, position, consistency, mobility, non-tender              Adnexa: normal adnexa and no mass, fullness, tenderness               Rectovaginal: Confirms               Anus:  normal sphincter tone, no lesions  A:  Well Woman with normal exam History of AGUS with LGSIL on biopsy and negative EMB.  P:   Mammogram - patient will schedule 3D.  pap smear and high risk HPV testing.  Discussed weight bearing exercise. return annually or prn  An After Visit Summary was printed and given to the patient.

## 2014-02-16 LAB — IPS PAP TEST WITH HPV

## 2014-03-14 ENCOUNTER — Encounter: Payer: Self-pay | Admitting: Obstetrics and Gynecology

## 2014-10-12 ENCOUNTER — Telehealth: Payer: Self-pay | Admitting: Obstetrics and Gynecology

## 2014-10-12 NOTE — Telephone Encounter (Signed)
Spoke with patient. Patient states she went for her 3D mammogram and was advised she did not owe anything at the time of the appointment. Now has received a letter stating her 3D mammogram is not covered by her insurance. "I usually pay 50 dollars up front and then that takes care of everything. Now the insurance company is stating they do not cover this at all. I had no idea until I got the letter and do not know how much it will be." Advised we have received a letter as well and will review this with the administrative staff regarding how to proceed. Advised our office currently does not write appeals for mammograms, but this will be reviewed and return call will be made to patient. Patient is agreeable.

## 2014-10-12 NOTE — Telephone Encounter (Signed)
Patient says her 3D MMG has been denied by her insurance. Patient is asking for suggestions to help get this covered. Patient says this was covered last year due to her dense breast..

## 2014-10-12 NOTE — Telephone Encounter (Signed)
Thank you for contacting the patient with this preliminary information.

## 2014-11-04 NOTE — Telephone Encounter (Signed)
Patient would like to discuss the results of her MMG. Patient was told she had calcifications and additional testing would need to be done. Last seen 02/11/14.

## 2014-11-04 NOTE — Telephone Encounter (Signed)
Spoke with patient. Patient had mammogram performed on 04/18/2014. States she was advised to have a follow up mammogram in six months. "I really do not want to go back this month for the mammogram because I do not want all the radiation. The radiologist told me they were likely just calcium deposits and nothing to worry about." Advised per results notes Dr.Silva also recommends repeat mammogram in 6 months which would be due now in June. Patient again states "I do not want to have this done unless it is absolutely necessary. I am concerned about all the radiation." Patient requesting I speak with Dr.Silva regarding recommendations and return call.

## 2014-11-04 NOTE — Telephone Encounter (Signed)
My recommendation is for the patient to have consultation with the the radiologist to discuss follow up mammogram versus biopsy to have a tissue diagnosis of the area.  Thanks.

## 2014-11-07 NOTE — Telephone Encounter (Signed)
Spoke with patient. Advised of message as seen below from Ritchey. Patient is agreeable and verbalizes understanding. Will call to schedule appointment.  Routing to provider for final review. Patient agreeable to disposition. Will close encounter.

## 2014-11-07 NOTE — Telephone Encounter (Signed)
Left message to call Kaitlyn at [phone]. 

## 2015-01-12 ENCOUNTER — Telehealth: Payer: Self-pay | Admitting: *Deleted

## 2015-01-12 NOTE — Telephone Encounter (Signed)
Patient due for Diagnostic Right MMG due to findings of Amorphous Calcification on previous study.  Recommended 6 month follow up with diagnostic mammogram.  Last MMG:  04/15/14  Pt overdue for mammogram recall.  Due June 2016.  Follow up appointment has not been made.    Letter received from [facility] advising follow up appointment has not been made.  I verified this with Leafy Ro at [place] today.  Patient is scheduled for Screening Mammogram on February 28, 2015 which is too early for screening exam.  Patient scheduled this appointment on-line.    Anna Young was notified via phone by our office in 10/12/14 phone note that her follow up imaging was needed.  Patient stated she was concerned about radiation and was advised per Dr. Quincy Simmonds to schedule a consult appointment with the radiologist to discuss.  Patient was to call to schedule this appointment.  Please advise recall.

## 2015-01-13 NOTE — Telephone Encounter (Signed)
I would send a letter at this point to remind pt we don't have the follow up that was recommended and remove from recall.  Will send to Dr. Quincy Simmonds to ensure she agrees with plan.

## 2015-01-15 NOTE — Telephone Encounter (Signed)
I agree with this plan.  Patient has annual exam with me in November 2016.  Cc - Dr. Sabra Heck

## 2015-01-17 NOTE — Telephone Encounter (Signed)
Please write letter so it can be signed.

## 2015-01-18 ENCOUNTER — Encounter: Payer: Self-pay | Admitting: *Deleted

## 2015-01-18 NOTE — Telephone Encounter (Signed)
Letter created and routed to Dr. Sabra Heck.

## 2015-01-20 ENCOUNTER — Encounter: Payer: Self-pay | Admitting: *Deleted

## 2015-01-20 NOTE — Telephone Encounter (Signed)
Letter marked as sent and mailed.  Pt removed from current recall.  Closing encounter.

## 2015-01-26 ENCOUNTER — Telehealth: Payer: Self-pay | Admitting: Obstetrics and Gynecology

## 2015-01-26 NOTE — Telephone Encounter (Signed)
Patient returning call.

## 2015-01-26 NOTE — Telephone Encounter (Signed)
Left message to call Kaitlyn at [phone]. 

## 2015-01-26 NOTE — Telephone Encounter (Signed)
Patient is asking to talk with Dr.Silva's nurse. No further details given, not urgent. Last seen 02/11/14.

## 2015-01-27 NOTE — Telephone Encounter (Signed)
Order faxed and patient notified.  She states she has been having itching of R breast only. She states it is intermittent and has been ongoing for one year. This is the same breast that she will have diagnostic imaging on on Monday.   Advised patient can obtain advised from Dr. Quincy Simmonds regarding R Breast Itching. Possibly will need breast check but will schedule after diagnostic imaging. Advised will call back with message from Dr. Quincy Simmonds. Patient agreeable. She will discuss with radiologist on Monday as well, because itching is all over entire breast when it does occur.

## 2015-01-27 NOTE — Telephone Encounter (Signed)
Ok to proceed with diagnostic mammogram on Monday.  Claritin or Benadryl may help with itching symptoms.   Benadryl sedates.  I am happy to see the patient sooner than November for her annual exam to re-evaluate with her.   Thanks.

## 2015-01-27 NOTE — Telephone Encounter (Signed)
Patient says she need an order for a mammogram faxed to [facility]. Appointment is Monday at 10:00.

## 2015-01-27 NOTE — Telephone Encounter (Signed)
Left Voicemail for pt to call back. 

## 2015-01-27 NOTE — Telephone Encounter (Signed)
Order to Dr.Silva. 

## 2015-02-09 ENCOUNTER — Ambulatory Visit: Payer: Managed Care, Other (non HMO) | Admitting: Family Medicine

## 2015-02-15 ENCOUNTER — Ambulatory Visit: Payer: Managed Care, Other (non HMO) | Admitting: Family Medicine

## 2015-02-15 NOTE — Telephone Encounter (Signed)
Call to patient. Patient has had diagnostic mammogram.  Annual exam moved up to 02-27-15.  Encounter closed.

## 2015-02-27 ENCOUNTER — Ambulatory Visit: Payer: Self-pay | Admitting: Obstetrics and Gynecology

## 2015-03-01 ENCOUNTER — Ambulatory Visit: Payer: BC Managed Care – PPO | Admitting: Obstetrics and Gynecology

## 2015-03-02 ENCOUNTER — Encounter: Payer: Self-pay | Admitting: Obstetrics and Gynecology

## 2015-03-02 ENCOUNTER — Ambulatory Visit (INDEPENDENT_AMBULATORY_CARE_PROVIDER_SITE_OTHER): Payer: BLUE CROSS/BLUE SHIELD | Admitting: Obstetrics and Gynecology

## 2015-03-02 VITALS — BP 110/70 | HR 64 | Resp 16 | Ht 63.0 in | Wt 116.6 lb

## 2015-03-02 DIAGNOSIS — R921 Mammographic calcification found on diagnostic imaging of breast: Secondary | ICD-10-CM

## 2015-03-02 DIAGNOSIS — Z0000 Encounter for general adult medical examination without abnormal findings: Secondary | ICD-10-CM

## 2015-03-02 DIAGNOSIS — N393 Stress incontinence (female) (male): Secondary | ICD-10-CM

## 2015-03-02 DIAGNOSIS — Z01419 Encounter for gynecological examination (general) (routine) without abnormal findings: Secondary | ICD-10-CM

## 2015-03-02 LAB — POCT URINALYSIS DIPSTICK
BILIRUBIN UA: NEGATIVE
Blood, UA: NEGATIVE
GLUCOSE UA: NEGATIVE
KETONES UA: NEGATIVE
Leukocytes, UA: NEGATIVE
Nitrite, UA: NEGATIVE
Protein, UA: NEGATIVE
Urobilinogen, UA: NEGATIVE
pH, UA: 5

## 2015-03-02 NOTE — Progress Notes (Signed)
Patient ID: Cacey Willow, female   DOB: 10-13-1960, 54 y.o.   MRN: [id number] 54 y.o. [id number] Married Caucasian female here for annual exam.    Menses less regular.  Occurring every other month.  Bleeding last for 5 - 6 days.  Maybe a little heavier, changes pad or tampon every 3 - 4 hours.  No significant pain.  No hot flashes.  Not interested in doing mammogram again in March 2017. Concerned about radiation exposure.   Having some neck pain due to an old injury.  Doing physical therapy.   Leaks urine with cough or sneeze and jumping jacks.   Husband diagnosed with testicular cancer a month ago.  PCP:   Adriana Simas, MD Labs with PCP.   Patient's last menstrual period was 02/25/2015 (exact date).          Sexually active: Yes.   husband The current method of family planning is vasectomy.    Exercising: No.   Smoker:  no  Health Maintenance: Pap:  02-11-14 Ascus:Neg HR HPV History of abnormal Pap:  Yes,08/2007 atypical glandular cells--negative endometrial bx and CIN I on colposcopy.  Patient states history of colposcopy and biopsies 1995 with no treatment to cervix--repeat pap smear was normal. MMG:  01-30-15 Diag.Unilateral, Density Cat.D/probable benign calcifications/BiRads 3/rec.6 month f/u of Rt.breast; 04/2014 Unilateral Rt. probale benign calcifications Rt.breast, left breast negative/Rec.f/u 6 months:[facility] Colonoscopy:  11-29-11 polyp with Dr. Delfin Edis. Next due 11/2016. BMD:   n/a  Result  n/a TDaP:  ?PCP Screening Labs:  Hb today: PCP, Urine today: Neg   reports that she has never smoked. She has never used smokeless tobacco. She reports that she does not drink alcohol or use illicit drugs.  Past Medical History  Diagnosis Date  . Hypothyroidism   . Urinary incontinence     especially with coughing and sneezing  . Arthritis     of neck  . Fibroids, intramural 2014    Past Surgical History  Procedure Laterality Date  . Mouth surgery      wisdom teeth   . Nasal septum surgery    . Breast enhancement surgery    . Facial cosmetic surgery      Current Outpatient Prescriptions  Medication Sig Dispense Refill  . Cyanocobalamin (B-12) 5000 MCG CAPS Take 1 capsule by mouth daily.    Marland Kitchen levothyroxine (SYNTHROID, LEVOTHROID) 88 MCG tablet Take 88 mcg by mouth daily.    Marland Kitchen liothyronine (CYTOMEL) 5 MCG tablet Take 5 mcg by mouth 2 (two) times daily with a meal.    . Multiple Vitamin (MULTI-VITAMINS PO) Take by mouth daily.    . Nutritional Supplements (DHEA PO) Take 1 tablet by mouth daily.    Marland Kitchen OVER THE COUNTER MEDICATION Vitamin D  10,000 every other day    . Probiotic Product (PROBIOTIC PO) Take by mouth daily.    Marland Kitchen VITAMIN A PO Take by mouth daily.     No current facility-administered medications for this visit.    Family History  Problem Relation Age of Onset  . Colon polyps Father   . Heart disease Father   . Stroke Father   . Diabetes Mother     diet controlled  . Colon cancer Neg Hx   . Stroke Maternal Grandfather     ROS:  Pertinent items are noted in HPI.  Otherwise, a comprehensive ROS was negative.  Exam:   BP 110/70 mmHg  Pulse 64  Resp 16  Ht 5\' 3"  (1.6 m)  Wt 116 lb 9.6 oz (52.889 kg)  BMI 20.66 kg/m2  LMP 02/25/2015 (Exact Date)    General appearance: alert, cooperative and appears stated age Head: Normocephalic, without obvious abnormality, atraumatic Neck: no adenopathy, supple, symmetrical, trachea midline and thyroid normal to inspection and palpation Lungs: clear to auscultation bilaterally Breasts: normal appearance, no masses or tenderness, Inspection negative, No nipple retraction or dimpling, No nipple discharge or bleeding, No axillary or supraclavicular adenopathy, bilateral implants. Heart: regular rate and rhythm Abdomen: soft, non-tender; bowel sounds normal; no masses,  no organomegaly Extremities: extremities normal, atraumatic, no cyanosis or edema Skin: Skin color, texture, turgor normal. No  rashes or lesions Lymph nodes: Cervical, supraclavicular, and axillary nodes normal. No abnormal inguinal nodes palpated Neurologic: Grossly normal  Pelvic: External genitalia:  no lesions              Urethra:  normal appearing urethra with no masses, tenderness or lesions              Bartholins and Skenes: normal                 Vagina: normal appearing vagina with normal color and discharge, no lesions              Cervix: no lesions              Pap taken: Yes.   Bimanual Exam:  Uterus:  enlarged, 8 weeks size  Irregular due to fibroids.              Adnexa: normal adnexa and no mass, fullness, tenderness              Rectovaginal: Yes.  .  Confirms.              Anus:  normal sphincter tone, no lesions  Chaperone was present for exam.  Assessment:   Well woman visit with normal exam. Hx of prior AGUS and ASCUS paps.  CIN I on colposcopic biopsy.  Calcifications of right breast.  Genuine stress incontinence.  Uterine fibroids.  Asymptomatic.   Plan: Yearly mammogram recommended after age 89.  Recommended self breast exam.  Pap and HR HPV as above. Discussed Calcium, Vitamin D, regular exercise program including cardiovascular and weight bearing exercise. Labs performed.  No..     Refills given on medications.  No..    Discussion regarding fibroids.  Will have patient meet with Dr. Isaiah Blakes regarding the recommendations for follow up mammogram. Refer to Ileana Roup at [facility]. Follow up annually and prn.      After visit summary provided.

## 2015-03-02 NOTE — Patient Instructions (Signed)

## 2015-03-02 NOTE — Progress Notes (Signed)
Patient scheduled for consult appointment with Dr. Guerry Minors at [facility] for 03/15/15 at 0945.

## 2015-03-06 LAB — IPS PAP TEST WITH HPV

## 2015-03-16 ENCOUNTER — Ambulatory Visit: Payer: Self-pay | Admitting: Obstetrics and Gynecology

## 2015-09-04 DIAGNOSIS — M542 Cervicalgia: Secondary | ICD-10-CM | POA: Diagnosis not present

## 2015-09-14 DIAGNOSIS — M542 Cervicalgia: Secondary | ICD-10-CM | POA: Diagnosis not present

## 2015-09-19 DIAGNOSIS — M542 Cervicalgia: Secondary | ICD-10-CM | POA: Diagnosis not present

## 2015-09-19 DIAGNOSIS — M4692 Unspecified inflammatory spondylopathy, cervical region: Secondary | ICD-10-CM | POA: Diagnosis not present

## 2015-09-19 DIAGNOSIS — M50322 Other cervical disc degeneration at C5-C6 level: Secondary | ICD-10-CM | POA: Diagnosis not present

## 2015-09-19 DIAGNOSIS — M50321 Other cervical disc degeneration at C4-C5 level: Secondary | ICD-10-CM | POA: Diagnosis not present

## 2015-09-25 DIAGNOSIS — E559 Vitamin D deficiency, unspecified: Secondary | ICD-10-CM | POA: Diagnosis not present

## 2015-09-25 DIAGNOSIS — N951 Menopausal and female climacteric states: Secondary | ICD-10-CM | POA: Diagnosis not present

## 2015-09-25 DIAGNOSIS — E039 Hypothyroidism, unspecified: Secondary | ICD-10-CM | POA: Diagnosis not present

## 2015-10-06 ENCOUNTER — Ambulatory Visit (INDEPENDENT_AMBULATORY_CARE_PROVIDER_SITE_OTHER): Payer: BLUE CROSS/BLUE SHIELD | Admitting: Family Medicine

## 2015-10-06 ENCOUNTER — Encounter: Payer: Self-pay | Admitting: Family Medicine

## 2015-10-06 VITALS — BP 92/68 | HR 86 | Ht 63.0 in | Wt 114.0 lb

## 2015-10-06 DIAGNOSIS — M9901 Segmental and somatic dysfunction of cervical region: Secondary | ICD-10-CM

## 2015-10-06 DIAGNOSIS — M47812 Spondylosis without myelopathy or radiculopathy, cervical region: Secondary | ICD-10-CM | POA: Insufficient documentation

## 2015-10-06 DIAGNOSIS — M9903 Segmental and somatic dysfunction of lumbar region: Secondary | ICD-10-CM | POA: Diagnosis not present

## 2015-10-06 DIAGNOSIS — M9902 Segmental and somatic dysfunction of thoracic region: Secondary | ICD-10-CM

## 2015-10-06 DIAGNOSIS — M5030 Other cervical disc degeneration, unspecified cervical region: Secondary | ICD-10-CM | POA: Diagnosis not present

## 2015-10-06 DIAGNOSIS — M999 Biomechanical lesion, unspecified: Secondary | ICD-10-CM | POA: Insufficient documentation

## 2015-10-06 MED ORDER — TIZANIDINE HCL 4 MG PO TABS: 4.0000 mg | ORAL_TABLET | Freq: Every evening | ORAL | Status: DC

## 2015-10-06 NOTE — Progress Notes (Signed)
[name] [address], [place] [address] Phone: [phone] Subjective:    PCP: Dr. Sharol Roussel CC:  Neck pain  [id number] Anna Young is a 55 y.o. female coming in with complaint of  Neck pain. Patient states that she had an injury approximate 30 years ago. 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 Patient did not have any true treatment for it. Since then was having worsening pain. Pain is severe enough that sometimes it can stop her from activity. Has noticed that she's had decreasing range of motion. Denies any radiation down the arms or any numbness or tingling. Patient rates the severity of pain though is 8 out of 10. Patient did see another provider and an MRI was ordered. MRI was independently visualized by me. MRI showed severe facet arthritis at C3-4, 45 with mild foraminal narrowing on the left side. No true nerve root impingement. Patient is tried over-the-counter medications and anti-inflammatories to help but should like to avoid using them on a regular basis. Patient is wondering how she can help slow down the progression. Patient was to avoid any surgery that was recommended.    Past Medical History  Diagnosis Date  . Hypothyroidism   . Urinary incontinence     especially with coughing and sneezing  . Arthritis     of neck  . Fibroids, intramural 2014   Past Surgical History  Procedure Laterality Date  . Mouth surgery      wisdom teeth  . Nasal septum surgery    . Breast enhancement surgery    . Facial cosmetic surgery     Social History   Social History  . Marital Status: Married    Spouse Name: N/A  . Number of Children: 3  . Years of Education: N/A   Occupational History  . realtor    Social History Main Topics  . Smoking status: Never Smoker   . Smokeless tobacco: Never Used  . Alcohol Use: No  . Drug Use: No  . Sexual Activity:    Partners: Male    Birth Control/ Protection: Other-see comments       Comment: husband with vasectomy   Other Topics Concern  . None   Social History Narrative   Allergies  Allergen Reactions  . Eggs Or Egg-Derived Products Other (See Comments)    "Egg whites"  . Gluten Meal Other (See Comments)    Abdominal pain  . Lactose Intolerance (Gi)    Family History  Problem Relation Age of Onset  . Colon polyps Father   . Heart disease Father   . Stroke Father   . Diabetes Mother     diet controlled  . Colon cancer Neg Hx   . Stroke Maternal Grandfather     Past medical history, social, surgical and family history all reviewed in electronic medical record.  No pertanent information unless stated regarding to the chief complaint.   Review of Systems: No headache, visual changes, nausea, vomiting, diarrhea, constipation, dizziness, abdominal pain, skin rash, fevers, chills, night sweats, weight loss, swollen lymph nodes, body aches, joint swelling, muscle aches, chest pain, shortness of breath, mood changes.   Objective Blood pressure 92/68, pulse 86, height 5\' 3"  (1.6 m), weight 114 lb (51.71 kg), SpO2 96 %.  General: No apparent distress alert and oriented x3 mood and affect normal, dressed appropriately.  HEENT: Pupils equal, extraocular movements intact  Respiratory: Patient's speak in full sentences and does not  appear short of breath  Cardiovascular: No lower extremity edema, non tender, no erythema  Skin: Warm dry intact with no signs of infection or rash on extremities or on axial skeleton.  Abdomen: Soft nontender  Neuro: Cranial nerves II through XII are intact, neurovascularly intact in all extremities with 2+ DTRs and 2+ pulses.  Lymph: No lymphadenopathy of posterior or anterior cervical chain or axillae bilaterally.  Gait normal with good balance and coordination.  MSK:  Non tender with full range of motion and good stability and symmetric strength and tone of shoulders, elbows, wrist, hip, knee and ankles bilaterally.   Neck: Inspection unremarkable. No palpable stepoffs. Negative Spurling's maneuver. Limited range of motion lacking the last 10 of side bending to the left and 5 of rotation to the left.\5 of extension. Grip strength and sensation normal in bilateral hands Strength good C4 to T1 distribution No sensory change to C4 to T1 Negative Hoffman sign bilaterally Reflexes normal  Osteopathic findings C2 flexed rotated and side bent right C4 flexed rotated and side bent left T1 extended rotated inside that right T4 extended rotated and side bent left L2 flexed rotated inside that right   Impression and Recommendations:     This case required medical decision making of moderate complexity.      Note: This dictation was prepared with Dragon dictation along with smaller phrase technology. Any transcriptional errors that result from this process are unintentional.

## 2015-10-06 NOTE — Assessment & Plan Note (Signed)
MRI outside facility does show that patient does have severe facet arthritis of C3-4 as well as C4-5 but no nerve root impingement. Mild foraminal narrowing noted. Patient is not having any radicular symptoms in his only having pain. We discussed proper lifting mechanics, given home exercises, we discussed icing regimen, as well as over-the-counter medications. Prescription for anti-inflammatory given. Patient did respond fairly well to osteopathic manipulation. Patient will come back and see me again in 3-4 weeks for further evaluation and treatment.

## 2015-10-06 NOTE — Assessment & Plan Note (Signed)
Decision today to treat with OMT was based on Physical Exam  After verbal consent patient was treated with HVLA, ME, FPR techniques in cervical, thoracic and lumbar areas  Patient tolerated the procedure well with improvement in symptoms  Patient given exercises, stretches and lifestyle modifications  See medications in patient instructions if given  Patient will follow up in 3-4 weeks                     

## 2015-10-06 NOTE — Progress Notes (Signed)
Pre visit review using our clinic review tool, if applicable. No additional management support is needed unless otherwise documented below in the visit note. 

## 2015-10-06 NOTE — Patient Instructions (Addendum)
Nice to meet you  Ice 20 minutes 2 times daily. Usually after activity and before bed. Exercises 3 times a week.  Monitor at eye level Tennis ball between shoulder blades with sitting long time.  Vitamin D at least 2000 IU daily  Turmeric 500mg  twice daily  Fish oil 2 grams daily  Tart cherry extract at night any dose If you need it zanaflex at night.   See me again in 3-4 weeks.

## 2015-10-27 ENCOUNTER — Ambulatory Visit: Payer: BLUE CROSS/BLUE SHIELD | Admitting: Family Medicine

## 2015-11-01 ENCOUNTER — Ambulatory Visit (INDEPENDENT_AMBULATORY_CARE_PROVIDER_SITE_OTHER): Payer: BLUE CROSS/BLUE SHIELD | Admitting: Family Medicine

## 2015-11-01 ENCOUNTER — Encounter: Payer: Self-pay | Admitting: Family Medicine

## 2015-11-01 VITALS — BP 102/74 | HR 92

## 2015-11-01 DIAGNOSIS — M5030 Other cervical disc degeneration, unspecified cervical region: Secondary | ICD-10-CM

## 2015-11-01 DIAGNOSIS — M9902 Segmental and somatic dysfunction of thoracic region: Secondary | ICD-10-CM

## 2015-11-01 DIAGNOSIS — M9901 Segmental and somatic dysfunction of cervical region: Secondary | ICD-10-CM | POA: Diagnosis not present

## 2015-11-01 DIAGNOSIS — G43909 Migraine, unspecified, not intractable, without status migrainosus: Secondary | ICD-10-CM | POA: Insufficient documentation

## 2015-11-01 DIAGNOSIS — M9903 Segmental and somatic dysfunction of lumbar region: Secondary | ICD-10-CM | POA: Diagnosis not present

## 2015-11-01 DIAGNOSIS — G43811 Other migraine, intractable, with status migrainosus: Secondary | ICD-10-CM

## 2015-11-01 DIAGNOSIS — M47812 Spondylosis without myelopathy or radiculopathy, cervical region: Secondary | ICD-10-CM

## 2015-11-01 DIAGNOSIS — M999 Biomechanical lesion, unspecified: Secondary | ICD-10-CM

## 2015-11-01 MED ORDER — DIAZEPAM 5 MG PO TABS: 5.0000 mg | ORAL_TABLET | Freq: Two times a day (BID) | ORAL | Status: DC | PRN

## 2015-11-01 MED ORDER — KETOROLAC TROMETHAMINE 60 MG/2ML IM SOLN
60.0000 mg | Freq: Once | INTRAMUSCULAR | Status: AC
  Administered 2015-11-01: 60 mg via INTRAMUSCULAR

## 2015-11-01 NOTE — Assessment & Plan Note (Signed)
Test history of migraines. We discussed icing regimen and home exercises. Discussed objective disease doing which ones to avoid. We discussed different triggers. Patient will avoid certain things. Given Valium for any type of breakthrough pain. Worsening symptoms patient knows to seek medical attention.

## 2015-11-01 NOTE — Assessment & Plan Note (Signed)
Severe osteoarthritic changes of the neck noted previously. This will likely has an exacerbation from time to time. Patient seems to be having more pain with the migraine at the moment. Encourage her to start the exercises again when feeling better. Prescription for Valium given to help with some of the headaches. Patient will come back and see me again in 3-4 weeks. Did respond fairly well to manipulation.

## 2015-11-01 NOTE — Progress Notes (Signed)
[facility] [address], [place] [address] Phone: [phone] Subjective:    PCP: Dr. Sharol Roussel CC:  Neck pain Follow-up  [id number] Anna Young is a 55 y.o. female coming in with complaint of  Neck pain. Patient states that she had an injury approximate 30 years ago. 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 Recently had an MRI that was found to have severe facet arthritis at C3-C4. Patient elected to try conservative therapy. We attempted osteopathic manipulation. States that she was feeling somewhat better. Has been doing the exercises but unfortunately is having worsening migraine headache now. States since she's been having this more neck pain as well. Not doing exercises regularly secondary to the pain. Rates the severity of the headache is 8 out of 10. Has a history of migraines previously and feels very similar. Does take Excedrin and sometimes seems to be helpful.    Past Medical History  Diagnosis Date  . Hypothyroidism   . Urinary incontinence     especially with coughing and sneezing  . Arthritis     of neck  . Fibroids, intramural 2014   Past Surgical History  Procedure Laterality Date  . Mouth surgery      wisdom teeth  . Nasal septum surgery    . Breast enhancement surgery    . Facial cosmetic surgery     Social History   Social History  . Marital Status: Married    Spouse Name: N/A  . Number of Children: 3  . Years of Education: N/A   Occupational History  . realtor    Social History Main Topics  . Smoking status: Never Smoker   . Smokeless tobacco: Never Used  . Alcohol Use: No  . Drug Use: No  . Sexual Activity:    Partners: Male    Birth Control/ Protection: Other-see comments     Comment: husband with vasectomy   Other Topics Concern  . None   Social History Narrative   Allergies  Allergen Reactions  . Eggs Or Egg-Derived Products Other (See Comments)    "Egg whites"  . Gluten Meal  Other (See Comments)    Abdominal pain  . Lactose Intolerance (Gi)    Family History  Problem Relation Age of Onset  . Colon polyps Father   . Heart disease Father   . Stroke Father   . Diabetes Mother     diet controlled  . Colon cancer Neg Hx   . Stroke Maternal Grandfather     Past medical history, social, surgical and family history all reviewed in electronic medical record.  No pertanent information unless stated regarding to the chief complaint.   Review of Systems: No headache, visual changes, nausea, vomiting, diarrhea, constipation, dizziness, abdominal pain, skin rash, fevers, chills, night sweats, weight loss, swollen lymph nodes, body aches, joint swelling, muscle aches, chest pain, shortness of breath, mood changes.   Objective Blood pressure 102/74, pulse 92.  General: No apparent distress alert and oriented x3 mood and affect normal, dressed appropriately.  HEENT: Pupils equal, extraocular movements intact  Respiratory: Patient's speak in full sentences and does not appear short of breath  Cardiovascular: No lower extremity edema, non tender, no erythema  Skin: Warm dry intact with no signs of infection or rash on extremities or on axial skeleton.  Abdomen: Soft nontender  Neuro: Cranial nerves II through XII are intact, neurovascularly intact in all extremities with 2+ DTRs and 2+ pulses.  Lymph: No lymphadenopathy of posterior or anterior cervical chain or axillae bilaterally.  Gait normal with good balance and coordination.  MSK:  Non tender with full range of motion and good stability and symmetric strength and tone of shoulders, elbows, wrist, hip, knee and ankles bilaterally.  Neck: Inspection unremarkable. No palpable stepoffs. Negative Spurling's maneuver. Limited range of motion lacking the last 10 of side bending to the left and 5 of rotation to the left.\5 of extension.Seems to muscle. Increasing muscle tension noted today. Grip strength and sensation  normal in bilateral hands Strength good C4 to T1 distribution No sensory change to C4 to T1 Negative Hoffman sign bilaterally Reflexes normal  Osteopathic findings C2 flexed rotated and side bent left C4 flexed rotated and side bent left T1 extended rotated inside that right T4 extended rotated and side bent left L2 flexed rotated inside that right   Impression and Recommendations:     This case required medical decision making of moderate complexity.      Note: This dictation was prepared with Dragon dictation along with smaller phrase technology. Any transcriptional errors that result from this process are unintentional.

## 2015-11-01 NOTE — Assessment & Plan Note (Signed)
Decision today to treat with OMT was based on Physical Exam  After verbal consent patient was treated with HVLA, ME, FPR techniques in cervical, thoracic and lumbar areas  Patient tolerated the procedure well with improvement in symptoms  Patient given exercises, stretches and lifestyle modifications  See medications in patient instructions if given  Patient will follow up in 3-4 weeks                     

## 2015-11-01 NOTE — Patient Instructions (Signed)
Good to see you  I am sorry for the headache One injection to help Valium if you need it up to 2 times a day but be a little careful.  I hope the manipulation helps See me again in 4 weeks.

## 2015-11-16 ENCOUNTER — Ambulatory Visit (INDEPENDENT_AMBULATORY_CARE_PROVIDER_SITE_OTHER): Payer: BLUE CROSS/BLUE SHIELD | Admitting: Family Medicine

## 2015-11-16 ENCOUNTER — Encounter: Payer: Self-pay | Admitting: Family Medicine

## 2015-11-16 VITALS — BP 104/72 | HR 86 | Wt 115.0 lb

## 2015-11-16 DIAGNOSIS — M9901 Segmental and somatic dysfunction of cervical region: Secondary | ICD-10-CM | POA: Diagnosis not present

## 2015-11-16 DIAGNOSIS — R921 Mammographic calcification found on diagnostic imaging of breast: Secondary | ICD-10-CM | POA: Diagnosis not present

## 2015-11-16 DIAGNOSIS — M9903 Segmental and somatic dysfunction of lumbar region: Secondary | ICD-10-CM | POA: Diagnosis not present

## 2015-11-16 DIAGNOSIS — M5030 Other cervical disc degeneration, unspecified cervical region: Secondary | ICD-10-CM | POA: Diagnosis not present

## 2015-11-16 DIAGNOSIS — M999 Biomechanical lesion, unspecified: Secondary | ICD-10-CM

## 2015-11-16 DIAGNOSIS — M9902 Segmental and somatic dysfunction of thoracic region: Secondary | ICD-10-CM | POA: Diagnosis not present

## 2015-11-16 DIAGNOSIS — M47812 Spondylosis without myelopathy or radiculopathy, cervical region: Secondary | ICD-10-CM

## 2015-11-16 MED ORDER — TIZANIDINE HCL 4 MG PO TABS: 4.0000 mg | ORAL_TABLET | Freq: Every evening | ORAL | Status: DC

## 2015-11-16 MED ORDER — GABAPENTIN 100 MG PO CAPS: 200.0000 mg | ORAL_CAPSULE | Freq: Every day | ORAL | Status: DC

## 2015-11-16 MED ORDER — MELOXICAM 15 MG PO TABS: 15.0000 mg | ORAL_TABLET | Freq: Every day | ORAL | Status: DC

## 2015-11-16 NOTE — Assessment & Plan Note (Signed)
Decision today to treat with OMT was based on Physical Exam  After verbal consent patient was treated with HVLA, ME, FPR techniques in cervical, thoracic and lumbar areas  Patient tolerated the procedure well with improvement in symptoms  Patient given exercises, stretches and lifestyle modifications  See medications in patient instructions if given  Patient will follow up in 4 weeks                             

## 2015-11-16 NOTE — Progress Notes (Signed)
[facility] [place], [place] [address] Phone: [phone] Subjective:    PCP: Dr. Sharol Roussel CC:  Neck pain Follow-up  [id number] Armanii Fietz is a 55 y.o. female coming in with complaint of  Neck pain. Patient states that she had an injury approximate 30 years ago. 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 Recently had an MRI that was found to have severe facet arthritis at C3-C4. Patient elected to try conservative therapy. We attempted osteopathic manipulation. Has been doing fairly well. Did have a migraine previously. States that the manipulation did help.has not had a migraines since. Patient continues to have neck pain daily. Feels that something is done twice.    Past Medical History  Diagnosis Date  . Hypothyroidism   . Urinary incontinence     especially with coughing and sneezing  . Arthritis     of neck  . Fibroids, intramural 2014   Past Surgical History  Procedure Laterality Date  . Mouth surgery      wisdom teeth  . Nasal septum surgery    . Breast enhancement surgery    . Facial cosmetic surgery     Social History   Social History  . Marital Status: Married    Spouse Name: N/A  . Number of Children: 3  . Years of Education: N/A   Occupational History  . realtor    Social History Main Topics  . Smoking status: Never Smoker   . Smokeless tobacco: Never Used  . Alcohol Use: No  . Drug Use: No  . Sexual Activity:    Partners: Male    Birth Control/ Protection: Other-see comments     Comment: husband with vasectomy   Other Topics Concern  . Not on file   Social History Narrative   Allergies  Allergen Reactions  . Eggs Or Egg-Derived Products Other (See Comments)    "Egg whites"  . Gluten Meal Other (See Comments)    Abdominal pain  . Lactose Intolerance (Gi)    Family History  Problem Relation Age of Onset  . Colon polyps Father   . Heart disease Father   . Stroke Father   .  Diabetes Mother     diet controlled  . Colon cancer Neg Hx   . Stroke Maternal Grandfather     Past medical history, social, surgical and family history all reviewed in electronic medical record.  No pertanent information unless stated regarding to the chief complaint.   Review of Systems: No headache, visual changes, nausea, vomiting, diarrhea, constipation, dizziness, abdominal pain, skin rash, fevers, chills, night sweats, weight loss, swollen lymph nodes, body aches, joint swelling, muscle aches, chest pain, shortness of breath, mood changes.   Objective Blood pressure 104/72, pulse 86, weight 115 lb (52.164 kg).  General: No apparent distress alert and oriented x3 mood and affect normal, dressed appropriately.  HEENT: Pupils equal, extraocular movements intact  Respiratory: Patient's speak in full sentences and does not appear short of breath  Cardiovascular: No lower extremity edema, non tender, no erythema  Skin: Warm dry intact with no signs of infection or rash on extremities or on axial skeleton.  Abdomen: Soft nontender  Neuro: Cranial nerves II through XII are intact, neurovascularly intact in all extremities with 2+ DTRs and 2+ pulses.  Lymph: No lymphadenopathy of posterior or anterior cervical chain or axillae bilaterally.  Gait normal with good balance and coordination.  MSK:  Non tender with full  range of motion and good stability and symmetric strength and tone of shoulders, elbows, wrist, hip, knee and ankles bilaterally.  Neck: Inspection unremarkable. No palpable stepoffs. Negative Spurling's maneuver. Continued limited range of motion with side bending and rotation bilaterally Discussed muscle tension and previous exam. Grip strength and sensation normal in bilateral hands Strength good C4 to T1 distribution No sensory change to C4 to T1 Negative Hoffman sign bilaterally Reflexes normal  Osteopathic findings C2 flexed rotated and side bent left C4 flexed  rotated and side bent left T1 extended rotated inside that right T4 extended rotated and side bent left L2 flexed rotated inside that right Sacrum right on right   Impression and Recommendations:     This case required medical decision making of moderate complexity.      Note: This dictation was prepared with Dragon dictation along with smaller phrase technology. Any transcriptional errors that result from this process are unintentional.

## 2015-11-16 NOTE — Assessment & Plan Note (Signed)
Patient will be started on gabapentin. IT is beneficial. If any radicular symptoms occur or chronic pain does not seem to be resolving I think that possible epidurals could be beneficial. Patient also given refills a muscle relaxer and anti-inflammatory's. Continue ergonomics and posture.

## 2015-11-16 NOTE — Patient Instructions (Signed)
Good to see you  Ice is still good.  Gabapentin 100mg  at night for 3 nights then 200mg  nightly thereafter if not too groggy in AM If still having trouble sleeping try the zanaflex.  Meloxicam daily if you need it.  Keep staying active.  If worsening symptoms we can always consider epidural.

## 2015-11-28 ENCOUNTER — Encounter: Payer: Self-pay | Admitting: Obstetrics and Gynecology

## 2015-12-11 NOTE — Progress Notes (Signed)
[name] [address] [place], [place] [address] Phone: [phone] Subjective:    PCP: Dr. Sharol Roussel CC:  Neck pain Follow-up  [id number]  Anna Young is a 55 y.o. female coming in with complaint of  Neck pain. Patient states that she had an injury approximate 30 years ago. 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 Recently had an MRI that was found to have severe facet arthritis at C3-C4. Report scanned.  Patient elected to try conservative therapy. We attempted osteopathic manipulation. Started on gabapentin and zanaflex. Patient has not been taking the medicines regularly. Was on vacation for 2 weeks and states that that was helpful but did not do any the exercises. Mild stiffness but otherwise no significant pain. Overall feels like she is doing relatively well.    Past Medical History:  Diagnosis Date  . Arthritis    of neck  . Fibroids, intramural 2014  . Hypothyroidism   . Urinary incontinence    especially with coughing and sneezing   Past Surgical History:  Procedure Laterality Date  . BREAST ENHANCEMENT SURGERY    . FACIAL COSMETIC SURGERY    . MOUTH SURGERY     wisdom teeth  . NASAL SEPTUM SURGERY     Social History   Social History  . Marital status: Married    Spouse name: N/A  . Number of children: 3  . Years of education: N/A   Occupational History  . realtor    Social History Main Topics  . Smoking status: Never Smoker  . Smokeless tobacco: Never Used  . Alcohol use No  . Drug use: No  . Sexual activity: Yes    Partners: Male    Birth control/ protection: Other-see comments     Comment: husband with vasectomy   Other Topics Concern  . None   Social History Narrative  . None   Allergies  Allergen Reactions  . Eggs Or Egg-Derived Products Other (See Comments)    "Egg whites"  . Gluten Meal Other (See Comments)    Abdominal pain  . Lactose Intolerance (Gi)    Family History  Problem  Relation Age of Onset  . Colon polyps Father   . Heart disease Father   . Stroke Father   . Diabetes Mother     diet controlled  . Colon cancer Neg Hx   . Stroke Maternal Grandfather     Past medical history, social, surgical and family history all reviewed in electronic medical record.  No pertanent information unless stated regarding to the chief complaint.   Review of Systems: No headache, visual changes, nausea, vomiting, diarrhea, constipation, dizziness, abdominal pain, skin rash, fevers, chills, night sweats, weight loss, swollen lymph nodes, body aches, joint swelling, muscle aches, chest pain, shortness of breath, mood changes.   Objective  Blood pressure 106/70, pulse 76, weight 116 lb (52.6 kg), SpO2 98 %.  General: No apparent distress alert and oriented x3 mood and affect normal, dressed appropriately.  HEENT: Pupils equal, extraocular movements intact  Respiratory: Patient's speak in full sentences and does not appear short of breath  Cardiovascular: No lower extremity edema, non tender, no erythema  Skin: Warm dry intact with no signs of infection or rash on extremities or on axial skeleton.  Abdomen: Soft nontender  Neuro: Cranial nerves II through XII are intact, neurovascularly intact in all extremities with 2+ DTRs and 2+ pulses.  Lymph: No lymphadenopathy of posterior or anterior cervical  chain or axillae bilaterally.  Gait normal with good balance and coordination.  MSK:  Non tender with full range of motion and good stability and symmetric strength and tone of shoulders, elbows, wrist, hip, knee and ankles bilaterally.  Neck: Inspection unremarkable. No palpable stepoffs. Negative Spurling's maneuver. Continued limited range of motion with side bending and rotation bilaterally Seems to be patient's baseline Continued to have improvement in tightness. Grip strength and sensation normal in bilateral hands Strength good C4 to T1 distribution No sensory change to  C4 to T1 Negative Hoffman sign bilaterally Reflexes normal  Osteopathic findings C2 flexed rotated and side bent left C5 flexed rotated and side bent left T1 extended rotated inside that right T4 extended rotated and side bent left L2 flexed rotated inside that right Sacrum right on right   Impression and Recommendations:     This case required medical decision making of moderate complexity.      Note: This dictation was prepared with Dragon dictation along with smaller phrase technology. Any transcriptional errors that result from this process are unintentional.

## 2015-12-12 ENCOUNTER — Ambulatory Visit (INDEPENDENT_AMBULATORY_CARE_PROVIDER_SITE_OTHER): Payer: BLUE CROSS/BLUE SHIELD | Admitting: Family Medicine

## 2015-12-12 ENCOUNTER — Encounter: Payer: Self-pay | Admitting: Family Medicine

## 2015-12-12 VITALS — BP 106/70 | HR 76 | Wt 116.0 lb

## 2015-12-12 DIAGNOSIS — M999 Biomechanical lesion, unspecified: Secondary | ICD-10-CM

## 2015-12-12 DIAGNOSIS — M9903 Segmental and somatic dysfunction of lumbar region: Secondary | ICD-10-CM

## 2015-12-12 DIAGNOSIS — M47812 Spondylosis without myelopathy or radiculopathy, cervical region: Secondary | ICD-10-CM

## 2015-12-12 DIAGNOSIS — M9901 Segmental and somatic dysfunction of cervical region: Secondary | ICD-10-CM | POA: Diagnosis not present

## 2015-12-12 DIAGNOSIS — M9902 Segmental and somatic dysfunction of thoracic region: Secondary | ICD-10-CM

## 2015-12-12 DIAGNOSIS — M5030 Other cervical disc degeneration, unspecified cervical region: Secondary | ICD-10-CM

## 2015-12-12 NOTE — Assessment & Plan Note (Signed)
Decision today to treat with OMT was based on Physical Exam  After verbal consent patient was treated with HVLA, ME, FPR techniques in cervical, thoracic and lumbar areas  Patient tolerated the procedure well with improvement in symptoms  Patient given exercises, stretches and lifestyle modifications  See medications in patient instructions if given  Patient will follow up in 4-6 weeks

## 2015-12-12 NOTE — Patient Instructions (Signed)
Great to see you  Relaxation is good for you Start the routine again See me again in 4-6 weeks

## 2015-12-12 NOTE — Assessment & Plan Note (Signed)
No significant change in conservative therapy at this time. Has responded very well to osteopathic manipulation. We discussed with patient as well as icing protocol. Patient will continue this. Follow-up again in 4-6 weeks.

## 2016-01-11 NOTE — Progress Notes (Signed)
[name] [place], [place] [address] Phone: [phone] Subjective:    CC:  Neck pain Follow-up  [id number]  Anna Young is a 55 y.o. female coming in with complaint of  Neck pain. Patient states that she had an injury approximate 30 years ago. 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 Recently had an MRI that was found to have severe facet arthritis at C3-C4. Marland Kitchen  Patient elected to try conservative therapy. We attempted osteopathic manipulation. Started on gabapentin and zanaflex. Patient has not been taking the medicines regularly.  Patient at last exam did have some mild increasing tightness. Now states  she has not been doing the exercises regularly. When she does not do them she notices more discomfort. Patient states that she is going to try to do more religiously. No new symptoms.  Past Medical History:  Diagnosis Date  . Arthritis    of neck  . Fibroids, intramural 2014  . Hypothyroidism   . Urinary incontinence    especially with coughing and sneezing   Past Surgical History:  Procedure Laterality Date  . BREAST ENHANCEMENT SURGERY    . FACIAL COSMETIC SURGERY    . MOUTH SURGERY     wisdom teeth  . NASAL SEPTUM SURGERY     Social History   Social History  . Marital status: Married    Spouse name: N/A  . Number of children: 3  . Years of education: N/A   Occupational History  . realtor    Social History Main Topics  . Smoking status: Never Smoker  . Smokeless tobacco: Never Used  . Alcohol use No  . Drug use: No  . Sexual activity: Yes    Partners: Male    Birth control/ protection: Other-see comments     Comment: husband with vasectomy   Other Topics Concern  . Not on file   Social History Narrative  . No narrative on file   Allergies  Allergen Reactions  . Eggs Or Egg-Derived Products Other (See Comments)    "Egg whites"  . Gluten Meal Other (See Comments)    Abdominal pain  .  Lactose Intolerance (Gi)    Family History  Problem Relation Age of Onset  . Colon polyps Father   . Heart disease Father   . Stroke Father   . Diabetes Mother     diet controlled  . Colon cancer Neg Hx   . Stroke Maternal Grandfather     Past medical history, social, surgical and family history all reviewed in electronic medical record.  No pertanent information unless stated regarding to the chief complaint.   Review of Systems: No  visual changes, nausea, vomiting, diarrhea, constipation, dizziness, abdominal pain, skin rash, fevers, chills, night sweats, weight loss, swollen lymph nodes, body aches, joint swelling, muscle aches, chest pain, shortness of breath, mood changes.   Objective  There were no vitals taken for this visit.  General: No apparent distress alert and oriented x3 mood and affect normal, dressed appropriately.  HEENT: Pupils equal, extraocular movements intact  Respiratory: Patient's speak in full sentences and does not appear short of breath  Cardiovascular: No lower extremity edema, non tender, no erythema  Skin: Warm dry intact with no signs of infection or rash on extremities or on axial skeleton.  Abdomen: Soft nontender  Neuro: Cranial nerves II through XII are intact, neurovascularly intact in all extremities with 2+ DTRs and 2+ pulses.  Lymph:  No lymphadenopathy of posterior or anterior cervical chain or axillae bilaterally.  Gait normal with good balance and coordination.  MSK:  Non tender with full range of motion and good stability and symmetric strength and tone of shoulders, elbows, wrist, hip, knee and ankles bilaterally.  Neck: Inspection unremarkable. No palpable stepoffs. Negative Spurling's maneuver. Mild increasing tightness with rotation and side bending bilaterally Continued to have improvement in tightness. Grip strength and sensation normal in bilateral hands Strength good C4 to T1 distribution No sensory change to C4 to T1 Negative  Hoffman sign bilaterally Reflexes normal  Osteopathic findings C2 flexed rotated and side bent left C6 flexed rotated and side bent left T1 extended rotated inside that right T4 extended rotated and side bent left L2 flexed rotated inside that right Sacrum right on right   Impression and Recommendations:     This case required medical decision making of moderate complexity.      Note: This dictation was prepared with Dragon dictation along with smaller phrase technology. Any transcriptional errors that result from this process are unintentional.

## 2016-01-11 NOTE — Assessment & Plan Note (Signed)
Decision today to treat with OMT was based on Physical Exam  After verbal consent patient was treated with HVLA, ME, FPR techniques in cervical, thoracic and lumbar areas  Patient tolerated the procedure well with improvement in symptoms  Patient given exercises, stretches and lifestyle modifications  See medications in patient instructions if given  Patient will follow up in 4-12 weeks

## 2016-01-11 NOTE — Assessment & Plan Note (Signed)
Continues to have some intermittent pain. Secondary to the underlying arthritis. Is very severe but seems to be responding to conservative therapy. If worsening symptoms we will need to consider epidural. Otherwise patient continues to do well she can follow-up in 4-12 weeks.

## 2016-01-12 ENCOUNTER — Encounter: Payer: Self-pay | Admitting: Family Medicine

## 2016-01-12 ENCOUNTER — Ambulatory Visit (INDEPENDENT_AMBULATORY_CARE_PROVIDER_SITE_OTHER): Payer: BLUE CROSS/BLUE SHIELD | Admitting: Family Medicine

## 2016-01-12 VITALS — BP 104/72 | HR 76 | Wt 115.0 lb

## 2016-01-12 DIAGNOSIS — M47812 Spondylosis without myelopathy or radiculopathy, cervical region: Secondary | ICD-10-CM

## 2016-01-12 DIAGNOSIS — M9903 Segmental and somatic dysfunction of lumbar region: Secondary | ICD-10-CM

## 2016-01-12 DIAGNOSIS — M999 Biomechanical lesion, unspecified: Secondary | ICD-10-CM

## 2016-01-12 DIAGNOSIS — M5030 Other cervical disc degeneration, unspecified cervical region: Secondary | ICD-10-CM

## 2016-01-12 DIAGNOSIS — M9902 Segmental and somatic dysfunction of thoracic region: Secondary | ICD-10-CM

## 2016-01-12 DIAGNOSIS — M9901 Segmental and somatic dysfunction of cervical region: Secondary | ICD-10-CM | POA: Diagnosis not present

## 2016-01-12 NOTE — Patient Instructions (Signed)
Have fun with the grand baby Ice when you need it Use traction or the inversion table at the end of the day  Good luck with the new firm  You know we need ot do the posture exercises a little more.  See me again in 4 weeks.

## 2016-01-19 ENCOUNTER — Telehealth: Payer: Self-pay | Admitting: Family Medicine

## 2016-01-19 NOTE — Telephone Encounter (Signed)
Pt stated she got NS fee for 10/27/15. Pt stated she came in/check in and waited for an hour; she has to leave b/c she couldn't wait any longer. Pt stated she RS to 11/01/15 before she left. Please check and call her back

## 2016-01-22 NOTE — Telephone Encounter (Signed)
Emailed billing to void the NS fee for [facility] 10/27/15, pt notified.

## 2016-02-11 NOTE — Progress Notes (Signed)
[name] [address], [place] [address] Phone: [phone] Subjective:    CC:  Neck pain Follow-up  [id number]  Anna Young is a 55 y.o. female coming in with complaint of  Neck pain. Patient states that she had an injury approximate 30 years ago. 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 Recently had an MRI that was found to have severe facet arthritis at C3-C4. Marland Kitchen  Patient elected to try conservative therapy. We attempted osteopathic manipulation. Started on gabapentin and zanaflex. Patient has not been taking the medicines regularly.  Continued pain normal saline day. No radiation to the arms. Seems to be only and again direct location in her neck. States that certain movements seem to give her difficulty but overall nothing else significant. Patient has had increasing migraines recently as well.  Past Medical History:  Diagnosis Date  . Arthritis    of neck  . Fibroids, intramural 2014  . Hypothyroidism   . Urinary incontinence    especially with coughing and sneezing   Past Surgical History:  Procedure Laterality Date  . BREAST ENHANCEMENT SURGERY    . FACIAL COSMETIC SURGERY    . MOUTH SURGERY     wisdom teeth  . NASAL SEPTUM SURGERY     Social History   Social History  . Marital status: Married    Spouse name: N/A  . Number of children: 3  . Years of education: N/A   Occupational History  . realtor    Social History Main Topics  . Smoking status: Never Smoker  . Smokeless tobacco: Never Used  . Alcohol use No  . Drug use: No  . Sexual activity: Yes    Partners: Male    Birth control/ protection: Other-see comments     Comment: husband with vasectomy   Other Topics Concern  . None   Social History Narrative  . None   Allergies  Allergen Reactions  . Eggs Or Egg-Derived Products Other (See Comments)    "Egg whites"  . Gluten Meal Other (See Comments)    Abdominal pain  . Lactose Intolerance  (Gi)    Family History  Problem Relation Age of Onset  . Colon polyps Father   . Heart disease Father   . Stroke Father   . Diabetes Mother     diet controlled  . Colon cancer Neg Hx   . Stroke Maternal Grandfather     Past medical history, social, surgical and family history all reviewed in electronic medical record.  No pertanent information unless stated regarding to the chief complaint.   Review of Systems: No  visual changes, nausea, vomiting, diarrhea, constipation, dizziness, abdominal pain, skin rash, fevers, chills, night sweats, weight loss, swollen lymph nodes, body aches, joint swelling, muscle aches, chest pain, shortness of breath, mood changes.   Objective  Blood pressure 114/80, pulse 72, weight 117 lb (53.1 kg), SpO2 99 %.  General: No apparent distress alert and oriented x3 mood and affect normal, dressed appropriately.  HEENT: Pupils equal, extraocular movements intact  Respiratory: Patient's speak in full sentences and does not appear short of breath  Cardiovascular: No lower extremity edema, non tender, no erythema  Skin: Warm dry intact with no signs of infection or rash on extremities or on axial skeleton.  Abdomen: Soft nontender  Neuro: Cranial nerves II through XII are intact, neurovascularly intact in all extremities with 2+ DTRs and 2+ pulses.  Lymph: No lymphadenopathy of  posterior or anterior cervical chain or axillae bilaterally.  Gait normal with good balance and coordination.  MSK:  Non tender with full range of motion and good stability and symmetric strength and tone of shoulders, elbows, wrist, hip, knee and ankles bilaterally.  Neck: Inspection unremarkable. No palpable stepoffs. Negative Spurling's maneuver. Mild improvement in range of motion from previous exam Tightness and mild discomfort in palpation of the paraspinal musculature of the cervical neck Grip strength and sensation normal in bilateral hands Strength good C4 to T1  distribution No sensory change to C4 to T1 Negative Hoffman sign bilaterally Reflexes normal  Osteopathic findings C2 flexed rotated and side bent left C4 flexed rotated and side bent left T1 extended rotated inside that right T7 extended rotated and side bent left L2 flexed rotated inside that right Sacrum right on right   Impression and Recommendations:     This case required medical decision making of moderate complexity.      Note: This dictation was prepared with Dragon dictation along with smaller phrase technology. Any transcriptional errors that result from this process are unintentional.

## 2016-02-12 ENCOUNTER — Ambulatory Visit (INDEPENDENT_AMBULATORY_CARE_PROVIDER_SITE_OTHER): Payer: BLUE CROSS/BLUE SHIELD | Admitting: Family Medicine

## 2016-02-12 ENCOUNTER — Encounter: Payer: Self-pay | Admitting: Family Medicine

## 2016-02-12 VITALS — BP 114/80 | HR 72 | Wt 117.0 lb

## 2016-02-12 DIAGNOSIS — M999 Biomechanical lesion, unspecified: Secondary | ICD-10-CM | POA: Diagnosis not present

## 2016-02-12 DIAGNOSIS — M5030 Other cervical disc degeneration, unspecified cervical region: Secondary | ICD-10-CM

## 2016-02-12 DIAGNOSIS — M47812 Spondylosis without myelopathy or radiculopathy, cervical region: Secondary | ICD-10-CM

## 2016-02-12 NOTE — Assessment & Plan Note (Signed)
Decision today to treat with OMT was based on Physical Exam  After verbal consent patient was treated with HVLA, ME, FPR techniques in cervical, thoracic and lumbar areas  Patient tolerated the procedure well with improvement in symptoms  Patient given exercises, stretches and lifestyle modifications  See medications in patient instructions if given  Patient will follow up in 4-6 weeks

## 2016-02-12 NOTE — Assessment & Plan Note (Signed)
As well as specific area of injury likely contributed to the amount of arthritis that has been seen. Facet arthritic changes noted. We would need to consider an epidural injection which is a possibility. Patient declined that at this moment. Still has a medication for any breakthrough pain. Has responded fairly well to osteopathic manipulation. Encourage her to work out on a more regular basis. Follow-up again in 4-6 weeks.

## 2016-02-16 DIAGNOSIS — E039 Hypothyroidism, unspecified: Secondary | ICD-10-CM | POA: Diagnosis not present

## 2016-03-18 ENCOUNTER — Ambulatory Visit: Payer: BLUE CROSS/BLUE SHIELD | Admitting: Family Medicine

## 2016-03-20 ENCOUNTER — Ambulatory Visit: Payer: BLUE CROSS/BLUE SHIELD | Admitting: Obstetrics and Gynecology

## 2016-04-12 NOTE — Progress Notes (Deleted)
55 y.o. G29P3003 Married Caucasian female here for annual exam.    PCP:     No LMP recorded.           Sexually active: {yes no:314532}  The current method of family planning is vasectomy.    Exercising: {yes no:314532}  {types:19826} Smoker:  no  Health Maintenance: Pap:  03-02-15 Neg:Neg HR HPV History of abnormal Pap:  Yes,08/2007 atypical glandular cells--negative endometrial bx and CIN I on colposcopy.  Patient states history of colposcopy and biopsies 1995 with no treatment to cervix--repeat pap smear was normal.  MMG:  11-16-15 3D/Diag.Bil./Density D/0.6cm grouped amorphous layering calcifications in Rt.br. At 11 o'clock;not significantly changed--likely represent fibrocystic change/rec.Diag.mmg in 18 months/BiRads2:[facility] Colonoscopy:  11-29-11 polyp with Dr. Delfin Edis. Next due 11/2016.  BMD:   n/a  Result  n/a TDaP:  *** Gardasil:   N/A HIV: Hep C: Screening Labs:  Hb today: ***, Urine today: ***   reports that she has never smoked. She has never used smokeless tobacco. She reports that she does not drink alcohol or use drugs.  Past Medical History:  Diagnosis Date  . Arthritis    of neck  . Fibroids, intramural 2014  . Hypothyroidism   . Urinary incontinence    especially with coughing and sneezing    Past Surgical History:  Procedure Laterality Date  . BREAST ENHANCEMENT SURGERY    . FACIAL COSMETIC SURGERY    . MOUTH SURGERY     wisdom teeth  . NASAL SEPTUM SURGERY      Current Outpatient Prescriptions  Medication Sig Dispense Refill  . Cyanocobalamin (B-12) 5000 MCG CAPS Take 1 capsule by mouth daily.    . diazepam (VALIUM) 5 MG tablet Take 1 tablet (5 mg total) by mouth every 12 (twelve) hours as needed. 10 tablet 0  . gabapentin (NEURONTIN) 100 MG capsule Take 2 capsules (200 mg total) by mouth at bedtime. 60 capsule 3  . levothyroxine (SYNTHROID, LEVOTHROID) 88 MCG tablet Take 88 mcg by mouth daily.    Marland Kitchen liothyronine (CYTOMEL) 5 MCG tablet Take 5 mcg by  mouth 2 (two) times daily with a meal.    . meloxicam (MOBIC) 15 MG tablet Take 1 tablet (15 mg total) by mouth daily. 90 tablet 1  . Multiple Vitamin (MULTI-VITAMINS PO) Take by mouth daily.    . Nutritional Supplements (DHEA PO) Take 1 tablet by mouth daily.    Marland Kitchen OVER THE COUNTER MEDICATION Vitamin D  10,000 every other day    . Probiotic Product (PROBIOTIC PO) Take by mouth daily.    Marland Kitchen tiZANidine (ZANAFLEX) 4 MG tablet Take 1 tablet (4 mg total) by mouth Nightly. 30 tablet 2  . VITAMIN A PO Take by mouth daily.     No current facility-administered medications for this visit.     Family History  Problem Relation Age of Onset  . Colon polyps Father   . Heart disease Father   . Stroke Father   . Diabetes Mother     diet controlled  . Colon cancer Neg Hx   . Stroke Maternal Grandfather     ROS:  Pertinent items are noted in HPI.  Otherwise, a comprehensive ROS was negative.  Exam:   There were no vitals taken for this visit.    General appearance: alert, cooperative and appears stated age Head: Normocephalic, without obvious abnormality, atraumatic Neck: no adenopathy, supple, symmetrical, trachea midline and thyroid normal to inspection and palpation Lungs: clear to auscultation bilaterally Breasts:  normal appearance, no masses or tenderness, No nipple retraction or dimpling, No nipple discharge or bleeding, No axillary or supraclavicular adenopathy Heart: regular rate and rhythm Abdomen: soft, non-tender; no masses, no organomegaly Extremities: extremities normal, atraumatic, no cyanosis or edema Skin: Skin color, texture, turgor normal. No rashes or lesions Lymph nodes: Cervical, supraclavicular, and axillary nodes normal. No abnormal inguinal nodes palpated Neurologic: Grossly normal  Pelvic: External genitalia:  no lesions              Urethra:  normal appearing urethra with no masses, tenderness or lesions              Bartholins and Skenes: normal                  Vagina: normal appearing vagina with normal color and discharge, no lesions              Cervix: no lesions              Pap taken: {yes no:314532} Bimanual Exam:  Uterus:  normal size, contour, position, consistency, mobility, non-tender              Adnexa: no mass, fullness, tenderness              Rectal exam: {yes no:314532}.  Confirms.              Anus:  normal sphincter tone, no lesions  Chaperone was present for exam.  Assessment:   Well woman visit with normal exam.   Plan: Yearly mammogram recommended after age 84.  Recommended self breast exam.  Pap and HR HPV as above. Discussed Calcium, Vitamin D, regular exercise program including cardiovascular and weight bearing exercise.   Follow up annually and prn.   Additional counseling given.  {yes [id number]. _______ minutes face to face time of which over 50% was spent in counseling.    After visit summary provided.

## 2016-04-15 ENCOUNTER — Ambulatory Visit: Payer: BLUE CROSS/BLUE SHIELD | Admitting: Obstetrics and Gynecology

## 2016-05-02 ENCOUNTER — Encounter: Payer: Self-pay | Admitting: Obstetrics and Gynecology

## 2016-05-02 ENCOUNTER — Ambulatory Visit (INDEPENDENT_AMBULATORY_CARE_PROVIDER_SITE_OTHER): Payer: BLUE CROSS/BLUE SHIELD | Admitting: Obstetrics and Gynecology

## 2016-05-02 VITALS — BP 110/60 | HR 84 | Resp 14 | Ht 63.0 in | Wt 117.0 lb

## 2016-05-02 DIAGNOSIS — N951 Menopausal and female climacteric states: Secondary | ICD-10-CM

## 2016-05-02 DIAGNOSIS — N926 Irregular menstruation, unspecified: Secondary | ICD-10-CM

## 2016-05-02 DIAGNOSIS — Z01419 Encounter for gynecological examination (general) (routine) without abnormal findings: Secondary | ICD-10-CM | POA: Diagnosis not present

## 2016-05-02 MED ORDER — MEDROXYPROGESTERONE ACETATE 5 MG PO TABS: ORAL_TABLET | ORAL | 3 refills | Status: DC

## 2016-05-02 NOTE — Progress Notes (Signed)
55 y.o. [id number] MarriedCaucasianF here for annual exam.   Cycles q 1-3 months x 3-4 days. She can saturate a pad in 4 hours. Mild cramps. Sexually active, no pain. Hot flashes and night sweats are mild and tolerable.  Husband has a h/o testicular cancer, doing well.  Period Duration (Days): 3-4 Period Pattern: (!) Irregular Menstrual Flow: Moderate, Light Menstrual Control: Tampon, Hospital pad, Panty liner Dysmenorrhea: (!) Mild Dysmenorrhea Symptoms: Cramping   Ultrasound 10/14: 4 intramural fibroids with largest diameters of 1.6 cm, 2.3 cm, 1.1 cm, and 1.3 cm.  EMS 4.32 mm.  Normal right ovary and left corpus luteum cyst 1.9 mm.  Patient's last menstrual period was 04/15/2016.          Sexually active: Yes.    The current method of family planning is vasectomy.    Exercising: No.  The patient does not participate in regular exercise at present. Smoker:  no  Health Maintenance: Pap:  03/02/15 Neg. HR HPV:Neg, 10/15: ASCUS, negative hpv, 10/14: pap and hpv negative.  History of abnormal Pap:  Yes, 4/09: AGUS, negative endometrial biopsy, CIN 1 on colposcopy. No cervical surgery. MMG:  11/16/15 BIRADS2:Benign Colonoscopy:  11/29/11 Polyps- f/u 5 years  BMD:  Never TDaP:  PCP   reports that she has never smoked. She has never used smokeless tobacco. She reports that she does not drink alcohol or use drugs.Kids are almost 28, 25 and 23. Every one is out of the house. One year old grand daughter. 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   Past Medical History:  Diagnosis Date  . Arthritis    of neck  . Fibroids, intramural 2014  . Hypothyroidism   . Urinary incontinence    especially with coughing and sneezing    Past Surgical History:  Procedure Laterality Date  . BREAST ENHANCEMENT SURGERY    . FACIAL COSMETIC SURGERY    . MOUTH SURGERY     wisdom teeth  . NASAL SEPTUM SURGERY      Current Outpatient Prescriptions  Medication Sig Dispense Refill  . ARMOUR  THYROID 120 MG tablet Take 1 tablet by mouth daily.    . Ascorbic Acid (VITAMIN C) 100 MG tablet Take 100 mg by mouth daily.    . magnesium 30 MG tablet Take 30 mg by mouth daily.    . Multiple Vitamin (MULTI-VITAMINS PO) Take by mouth daily.    . Nutritional Supplements (DHEA PO) Take 1 tablet by mouth daily.    Marland Kitchen OVER THE COUNTER MEDICATION Vitamin D  10,000 every other day    . Probiotic Product (PROBIOTIC PO) Take by mouth daily.     No current facility-administered medications for this visit.     Family History  Problem Relation Age of Onset  . Colon polyps Father   . Heart disease Father   . Stroke Father   . Diabetes Mother     diet controlled  . Stroke Maternal Grandfather   . Colon cancer Neg Hx     Review of Systems  Constitutional: Negative.   HENT: Negative.   Eyes: Negative.   Respiratory: Negative.   Cardiovascular: Negative.   Gastrointestinal: Negative.   Endocrine: Negative.   Genitourinary: Negative.   Musculoskeletal: Negative.   Allergic/Immunologic: Negative.   Neurological: Negative.   Hematological: Negative.   Psychiatric/Behavioral: Negative.   GSI: small amounts with valsalva, tolerable. Some urinary frequency, occasional urge incontinence, no change. Urge incontinence is with a full bladder, small amounts.  Exam:   BP 110/60 (BP Location: Right Arm, Patient Position: Sitting, Cuff Size: Normal)   Pulse 84   Resp 14   Ht 5\' 3"  (1.6 m)   Wt 117 lb (53.1 kg)   LMP 04/15/2016   BMI 20.73 kg/m   Weight change: @WEIGHTCHANGE @ Height:   Height: 5\' 3"  (160 cm)  Ht Readings from Last 3 Encounters:  05/02/16 5\' 3"  (1.6 m)  10/06/15 5\' 3"  (1.6 m)  03/02/15 5\' 3"  (1.6 m)    General appearance: alert, cooperative and appears stated age Head: Normocephalic, without obvious abnormality, atraumatic Neck: no adenopathy, supple, symmetrical, trachea midline and thyroid normal to inspection and palpation Lungs: clear to auscultation  bilaterally Cardiovascular: regular rate and rhythm Breasts: normal appearance, no masses or tenderness Heart: regular rate and rhythm Abdomen: soft, non-tender; bowel sounds normal; no masses,  no organomegaly Extremities: extremities normal, atraumatic, no cyanosis or edema Skin: Skin color, texture, turgor normal. No rashes or lesions Lymph nodes: Cervical, supraclavicular, and axillary nodes normal. No abnormal inguinal nodes palpated Neurologic: Grossly normal   Pelvic: External genitalia:  no lesions              Urethra:  normal appearing urethra with no masses, tenderness or lesions              Bartholins and Skenes: normal                 Vagina: normal appearing vagina with normal color and discharge, no lesions, mild atrophy              Cervix: no lesions               Bimanual Exam:  Uterus:  normal size, contour, position, consistency, mobility, non-tender, anteverted, minimally enlarged              Adnexa: no mass, fullness, tenderness               Rectovaginal: Confirms               Anus:  normal sphincter tone, no lesions  Chaperone was present for exam.  A:  Well Woman with normal exam  Irregular cycles  Perimenopausal  P:   No pap this year  Discussed breast self exam  Discussed calcium and vit D intake  Mammogram UTD  Colonoscopy next summer  Discussed cyclic provera, script given to take if no cycle in 3 months

## 2016-05-02 NOTE — Patient Instructions (Signed)
EXERCISE AND DIET:  We recommended that you start or continue a regular exercise program for good health. Regular exercise means any activity that makes your heart beat faster and makes you sweat.  We recommend exercising at least 30 minutes per day at least 3 days a week, preferably 4 or 5.  We also recommend a diet low in fat and sugar.  Inactivity, poor dietary choices and obesity can cause diabetes, heart attack, stroke, and kidney damage, among others.    ALCOHOL AND SMOKING:  Women should limit their alcohol intake to no more than 7 drinks/beers/glasses of wine (combined, not each!) per week. Moderation of alcohol intake to this level decreases your risk of breast cancer and liver damage. And of course, no recreational drugs are part of a healthy lifestyle.  And absolutely no smoking or even second hand smoke. Most people know smoking can cause heart and lung diseases, but did you know it also contributes to weakening of your bones? Aging of your skin?  Yellowing of your teeth and nails?  CALCIUM AND VITAMIN D:  Adequate intake of calcium and Vitamin D are recommended.  The recommendations for exact amounts of these supplements seem to change often, but generally speaking 600 mg of calcium (either carbonate or citrate) and 800 units of Vitamin D per day seems prudent. Certain women may benefit from higher intake of Vitamin D.  If you are among these women, your doctor will have told you during your visit.    PAP SMEARS:  Pap smears, to check for cervical cancer or precancers,  have traditionally been done yearly, although recent scientific advances have shown that most women can have pap smears less often.  However, every woman still should have a physical exam from her gynecologist every year. It will include a breast check, inspection of the vulva and vagina to check for abnormal growths or skin changes, a visual exam of the cervix, and then an exam to evaluate the size and shape of the uterus and  ovaries.  And after 54 years of age, a rectal exam is indicated to check for rectal cancers. We will also provide age appropriate advice regarding health maintenance, like when you should have certain vaccines, screening for sexually transmitted diseases, bone density testing, colonoscopy, mammograms, etc.   MAMMOGRAMS:  All women over 40 years old should have a yearly mammogram. Many facilities now offer a "3D" mammogram, which may cost around $50 extra out of pocket. If possible,  we recommend you accept the option to have the 3D mammogram performed.  It both reduces the number of women who will be called back for extra views which then turn out to be normal, and it is better than the routine mammogram at detecting truly abnormal areas.    COLONOSCOPY:  Colonoscopy to screen for colon cancer is recommended for all women at age 50.  We know, you hate the idea of the prep.  We agree, BUT, having colon cancer and not knowing it is worse!!  Colon cancer so often starts as a polyp that can be seen and removed at colonscopy, which can quite literally save your life!  And if your first colonoscopy is normal and you have no family history of colon cancer, most women don't have to have it again for 10 years.  Once every ten years, you can do something that may end up saving your life, right?  We will be happy to help you get it scheduled when you are ready.    Be sure to check your insurance coverage so you understand how much it will cost.  It may be covered as a preventative service at no cost, but you should check your particular policy.     Perimenopause Perimenopause is the time when your body begins to move into the menopause (no menstrual period for 12 straight months). It is a natural process. Perimenopause can begin 2-8 years before the menopause and usually lasts for 1 year after the menopause. During this time, your ovaries may or may not produce an egg. The ovaries vary in their production of estrogen and  progesterone hormones each month. This can cause irregular menstrual periods, difficulty getting pregnant, vaginal bleeding between periods, and uncomfortable symptoms. CAUSES  Irregular production of the ovarian hormones, estrogen and progesterone, and not ovulating every month.  Other causes include:  Tumor of the pituitary gland in the brain.  Medical disease that affects the ovaries.  Radiation treatment.  Chemotherapy.  Unknown causes.  Heavy smoking and excessive alcohol intake can bring on perimenopause sooner. SIGNS AND SYMPTOMS   Hot flashes.  Night sweats.  Irregular menstrual periods.  Decreased sex drive.  Vaginal dryness.  Headaches.  Mood swings.  Depression.  Memory problems.  Irritability.  Tiredness.  Weight gain.  Trouble getting pregnant.  The beginning of losing bone cells (osteoporosis).  The beginning of hardening of the arteries (atherosclerosis). DIAGNOSIS  Your health care provider will make a diagnosis by analyzing your age, menstrual history, and symptoms. He or she will do a physical exam and note any changes in your body, especially your female organs. Female hormone tests may or may not be helpful depending on the amount of female hormones you produce and when you produce them. However, other hormone tests may be helpful to rule out other problems. TREATMENT  In some cases, no treatment is needed. The decision on whether treatment is necessary during the perimenopause should be made by you and your health care provider based on how the symptoms are affecting you and your lifestyle. Various treatments are available, such as:  Treating individual symptoms with a specific medicine for that symptom.  Herbal medicines that can help specific symptoms.  Counseling.  Group therapy. HOME CARE INSTRUCTIONS   Keep track of your menstrual periods (when they occur, how heavy they are, how long between periods, and how long they last) as  well as your symptoms and when they started.  Only take over-the-counter or prescription medicines as directed by your health care provider.  Sleep and rest.  Exercise.  Eat a diet that contains calcium (good for your bones) and soy (acts like the estrogen hormone).  Do not smoke.  Avoid alcoholic beverages.  Take vitamin supplements as recommended by your health care provider. Taking vitamin E may help in certain cases.  Take calcium and vitamin D supplements to help prevent bone loss.  Group therapy is sometimes helpful.  Acupuncture may help in some cases. SEEK MEDICAL CARE IF:   You have questions about any symptoms you are having.  You need a referral to a specialist (gynecologist, psychiatrist, or psychologist). SEEK IMMEDIATE MEDICAL CARE IF:   You have vaginal bleeding.  Your period lasts longer than 8 days.  Your periods are recurring sooner than 21 days.  You have bleeding after intercourse.  You have severe depression.  You have pain when you urinate.  You have severe headaches.  You have vision problems. This information is not intended to replace advice given to  you by your health care provider. Make sure you discuss any questions you have with your health care provider. Document Released: 06/06/2004 Document Revised: 05/20/2014 Document Reviewed: 11/26/2012 Elsevier Interactive Patient Education  2017 [employer].

## 2016-06-12 DIAGNOSIS — N959 Unspecified menopausal and perimenopausal disorder: Secondary | ICD-10-CM | POA: Diagnosis not present

## 2016-06-12 DIAGNOSIS — R5383 Other fatigue: Secondary | ICD-10-CM | POA: Diagnosis not present

## 2016-06-12 DIAGNOSIS — E039 Hypothyroidism, unspecified: Secondary | ICD-10-CM | POA: Diagnosis not present

## 2016-06-12 DIAGNOSIS — E559 Vitamin D deficiency, unspecified: Secondary | ICD-10-CM | POA: Diagnosis not present

## 2016-07-12 DIAGNOSIS — M4682 Other specified inflammatory spondylopathies, cervical region: Secondary | ICD-10-CM | POA: Diagnosis not present

## 2016-07-12 DIAGNOSIS — M50322 Other cervical disc degeneration at C5-C6 level: Secondary | ICD-10-CM | POA: Diagnosis not present

## 2016-07-12 DIAGNOSIS — M50321 Other cervical disc degeneration at C4-C5 level: Secondary | ICD-10-CM | POA: Diagnosis not present

## 2016-07-19 DIAGNOSIS — R5383 Other fatigue: Secondary | ICD-10-CM | POA: Diagnosis not present

## 2016-07-19 DIAGNOSIS — M542 Cervicalgia: Secondary | ICD-10-CM | POA: Diagnosis not present

## 2016-07-19 DIAGNOSIS — N959 Unspecified menopausal and perimenopausal disorder: Secondary | ICD-10-CM | POA: Diagnosis not present

## 2016-07-19 DIAGNOSIS — E039 Hypothyroidism, unspecified: Secondary | ICD-10-CM | POA: Diagnosis not present

## 2016-07-23 DIAGNOSIS — M542 Cervicalgia: Secondary | ICD-10-CM | POA: Diagnosis not present

## 2016-07-25 DIAGNOSIS — M542 Cervicalgia: Secondary | ICD-10-CM | POA: Diagnosis not present

## 2016-07-30 DIAGNOSIS — M542 Cervicalgia: Secondary | ICD-10-CM | POA: Diagnosis not present

## 2016-08-01 DIAGNOSIS — M542 Cervicalgia: Secondary | ICD-10-CM | POA: Diagnosis not present

## 2016-08-06 DIAGNOSIS — M542 Cervicalgia: Secondary | ICD-10-CM | POA: Diagnosis not present

## 2016-08-08 DIAGNOSIS — M542 Cervicalgia: Secondary | ICD-10-CM | POA: Diagnosis not present

## 2016-08-13 DIAGNOSIS — Z1211 Encounter for screening for malignant neoplasm of colon: Secondary | ICD-10-CM | POA: Diagnosis not present

## 2016-08-13 DIAGNOSIS — Z86010 Personal history of colonic polyps: Secondary | ICD-10-CM | POA: Diagnosis not present

## 2016-08-13 DIAGNOSIS — Z8371 Family history of colonic polyps: Secondary | ICD-10-CM | POA: Diagnosis not present

## 2016-08-13 DIAGNOSIS — K5904 Chronic idiopathic constipation: Secondary | ICD-10-CM | POA: Diagnosis not present

## 2016-08-20 DIAGNOSIS — M542 Cervicalgia: Secondary | ICD-10-CM | POA: Diagnosis not present

## 2016-08-22 DIAGNOSIS — M542 Cervicalgia: Secondary | ICD-10-CM | POA: Diagnosis not present

## 2016-09-02 DIAGNOSIS — M542 Cervicalgia: Secondary | ICD-10-CM | POA: Diagnosis not present

## 2016-09-09 DIAGNOSIS — M50321 Other cervical disc degeneration at C4-C5 level: Secondary | ICD-10-CM | POA: Diagnosis not present

## 2016-09-09 DIAGNOSIS — M4682 Other specified inflammatory spondylopathies, cervical region: Secondary | ICD-10-CM | POA: Diagnosis not present

## 2016-09-09 DIAGNOSIS — M50322 Other cervical disc degeneration at C5-C6 level: Secondary | ICD-10-CM | POA: Diagnosis not present

## 2016-10-04 ENCOUNTER — Encounter: Payer: Self-pay | Admitting: Gastroenterology

## 2016-12-03 DIAGNOSIS — L570 Actinic keratosis: Secondary | ICD-10-CM | POA: Diagnosis not present

## 2017-02-04 DIAGNOSIS — E039 Hypothyroidism, unspecified: Secondary | ICD-10-CM | POA: Diagnosis not present

## 2017-02-04 DIAGNOSIS — N959 Unspecified menopausal and perimenopausal disorder: Secondary | ICD-10-CM | POA: Diagnosis not present

## 2017-02-04 DIAGNOSIS — R5383 Other fatigue: Secondary | ICD-10-CM | POA: Diagnosis not present

## 2017-02-20 DIAGNOSIS — R5383 Other fatigue: Secondary | ICD-10-CM | POA: Diagnosis not present

## 2017-02-20 DIAGNOSIS — E039 Hypothyroidism, unspecified: Secondary | ICD-10-CM | POA: Diagnosis not present

## 2017-02-20 DIAGNOSIS — N959 Unspecified menopausal and perimenopausal disorder: Secondary | ICD-10-CM | POA: Diagnosis not present

## 2017-04-10 ENCOUNTER — Ambulatory Visit (INDEPENDENT_AMBULATORY_CARE_PROVIDER_SITE_OTHER)
Admission: RE | Admit: 2017-04-10 | Discharge: 2017-04-10 | Disposition: A | Discharge status: Home / Self Care | Payer: BLUE CROSS/BLUE SHIELD | Source: Ambulatory Visit | Attending: Family Medicine | Admitting: Family Medicine

## 2017-04-10 ENCOUNTER — Ambulatory Visit: Payer: BLUE CROSS/BLUE SHIELD | Admitting: Family Medicine

## 2017-04-10 ENCOUNTER — Ambulatory Visit: Payer: Self-pay

## 2017-04-10 ENCOUNTER — Telehealth: Payer: Self-pay | Admitting: Family Medicine

## 2017-04-10 ENCOUNTER — Encounter: Payer: Self-pay | Admitting: Family Medicine

## 2017-04-10 VITALS — BP 106/72 | HR 83 | Ht 63.0 in | Wt 117.0 lb

## 2017-04-10 DIAGNOSIS — M7500 Adhesive capsulitis of unspecified shoulder: Secondary | ICD-10-CM | POA: Insufficient documentation

## 2017-04-10 DIAGNOSIS — M7501 Adhesive capsulitis of right shoulder: Secondary | ICD-10-CM

## 2017-04-10 DIAGNOSIS — M25511 Pain in right shoulder: Secondary | ICD-10-CM | POA: Diagnosis not present

## 2017-04-10 MED ORDER — PREDNISONE 50 MG PO TABS: 50.0000 mg | ORAL_TABLET | Freq: Every day | ORAL | 0 refills | Status: DC

## 2017-04-10 NOTE — Progress Notes (Signed)
[name] [place], [place] [address] Phone: [phone] Subjective:    I'm seeing this patient by the request  of:    CC: Right shoulder pain  OAC:[id number]  Anna Young is a 56 y.o. female coming in with complaint of right shoulder pain.  Patient states over the course the last 2 months has been worsening recently.  Now noticing decreasing range of motion.  Patient states that the pain is severe 9 out of 10.  Sometimes feels like it is going to give out on her.  Denies any numbness.  Denies any radiation of the arm.  Has known degenerative disc disease of the cervical spine.  Patient denies of any neck pain.       Past Medical History:  Diagnosis Date  . Arthritis    of neck  . Fibroids, intramural 2014  . Hypothyroidism   . Urinary incontinence    especially with coughing and sneezing   Past Surgical History:  Procedure Laterality Date  . BREAST ENHANCEMENT SURGERY    . FACIAL COSMETIC SURGERY    . MOUTH SURGERY     wisdom teeth  . NASAL SEPTUM SURGERY     Social History   Socioeconomic History  . Marital status: Married    Spouse name: None  . Number of children: 3  . Years of education: None  . Highest education level: None  Social Needs  . Financial resource strain: None  . Food insecurity - worry: None  . Food insecurity - inability: None  . Transportation needs - medical: None  . Transportation needs - non-medical: None  Occupational History  . Occupation: realtor  Tobacco Use  . Smoking status: Never Smoker  . Smokeless tobacco: Never Used  Substance and Sexual Activity  . Alcohol use: No    Alcohol/week: 0.0 oz  . Drug use: No  . Sexual activity: Yes    Partners: Male    Birth control/protection: Other-see comments    Comment: husband with vasectomy  Other Topics Concern  . None  Social History Narrative  . None   Allergies  Allergen Reactions  . Eggs Or Egg-Derived Products Other (See  Comments)    "Egg whites"  . Gluten Meal Other (See Comments)    Abdominal pain  . Lactose Intolerance (Gi)    Family History  Problem Relation Age of Onset  . Colon polyps Father   . Heart disease Father   . Stroke Father   . Diabetes Mother        diet controlled  . Stroke Maternal Grandfather   . Colon cancer Neg Hx      Past medical history, social, surgical and family history all reviewed in electronic medical record.  No pertanent information unless stated regarding to the chief complaint.   Review of Systems:Review of systems updated and as accurate as of 04/10/17  No headache, visual changes, nausea, vomiting, diarrhea, constipation, dizziness, abdominal pain, skin rash, fevers, chills, night sweats, weight loss, swollen lymph nodes, body aches, joint swelling, muscle aches, chest pain, shortness of breath, mood changes.   Objective  Blood pressure 106/72, pulse 83, height 5\' 3"  (1.6 m), weight 117 lb (53.1 kg), SpO2 99 %. Systems examined below as of 04/10/17   General: No apparent distress alert and oriented x3 mood and affect normal, dressed appropriately.  HEENT: Pupils equal, extraocular movements intact  Respiratory: Patient's speak in full sentences and does not appear short of breath  Cardiovascular: No lower extremity edema, non tender, no erythema  Skin: Warm dry intact with no signs of infection or rash on extremities or on axial skeleton.  Abdomen: Soft nontender  Neuro: Cranial nerves II through XII are intact, neurovascularly intact in all extremities with 2+ DTRs and 2+ pulses.  Lymph: No lymphadenopathy of posterior or anterior cervical chain or axillae bilaterally.  Gait normal with good balance and coordination.  MSK:  Non tender with full range of motion and good stability and symmetric strength and tone of  elbows, wrist, hip, knee and ankles bilaterally.   Shoulder: Right Inspection reveals no abnormalities, atrophy or asymmetry. Palpation is  normal with no tenderness over AC joint or bicipital groove. Decreased range of motion lacking the last 15 degrees of external rotation, internal rotation to lateral hip, forward flexion of 160 degrees.. Rotator cuff strength normal throughout. Positive impingement Speeds and Yergason's tests normal. No labral pathology noted with negative Obrien's, negative clunk and good stability. Normal scapular function observed. No painful arc and no drop arm sign. No apprehension sign Contralateral shoulder unremarkable  Procedure: Real-time Ultrasound Guided Injection of right glenohumeral joint Device: GE Logiq Q7  Ultrasound guided injection is preferred based studies that show increased duration, increased effect, greater accuracy, decreased procedural pain, increased response rate with ultrasound guided versus blind injection.  Verbal informed consent obtained.  Time-out conducted.  Noted no overlying erythema, induration, or other signs of local infection.  Skin prepped in a sterile fashion.  Local anesthesia: Topical Ethyl chloride.  With sterile technique and under real time ultrasound guidance:  Joint visualized.  23g 1  inch needle inserted posterior approach. Pictures taken for needle placement. Patient did have injection of 2 cc of 1% lidocaine, 2 cc of 0.5% Marcaine, and 1.0 cc of Kenalog 40 mg/dL. Completed without difficulty  Pain immediately resolved suggesting accurate placement of the medication.  Advised to call if fevers/chills, erythema, induration, drainage, or persistent bleeding.  Images permanently stored and available for review in the ultrasound unit.  Impression: Technically successful ultrasound guided injection.  [id number]; 15 additional minutes spent for Therapeutic exercises as stated in above notes.  This included exercises focusing on stretching, strengthening, with significant focus on eccentric aspects.   Long term goals include an improvement in range of motion,  strength, endurance as well as avoiding reinjury. Patient's frequency would include in 1-2 times a day, 3-5 times a week for a duration of 6-12 weeks. Shoulder Exercises that included:  Basic scapular stabilization to include adduction and depression of scapula Scaption, focusing on proper movement and good control Internal and External rotation utilizing a theraband, with elbow tucked at side entire time Rows with theraband which was given   Proper technique shown and discussed handout in great detail with ATC.  All questions were discussed and answered.       Impression and Recommendations:     This case required medical decision making of moderate complexity.      Note: This dictation was prepared with Dragon dictation along with smaller phrase technology. Any transcriptional errors that result from this process are unintentional.

## 2017-04-10 NOTE — Telephone Encounter (Signed)
Copied from [place]. Topic: General - Other >> Apr 10, 2017  3:34 PM Yvette Rack wrote: Reason for CRM: patient states that she would like for the Allergic to eggs and egg white to be removed from her chart she states that she eat them everyday it must've been a mistake for someone to put that in her chart

## 2017-04-10 NOTE — Progress Notes (Signed)
[name] [address], [place] [address] Phone: [phone] Subjective:    I'm seeing this patient by the request  of:    CC:   [id number]  Anna Young is a 56 y.o. female coming in for right shoulder pain for 2 months. She is unable to raise her arm overhead. She does have a neck injury from 35 years ago. She has pain with ER, IR and forward flexion. Most of her pain is in the tricep. Patient also notes swelling in her neck and arm. Denies any numbness or tingling in her hand.        Past Medical History:  Diagnosis Date  . Arthritis    of neck  . Fibroids, intramural 2014  . Hypothyroidism   . Urinary incontinence    especially with coughing and sneezing   Past Surgical History:  Procedure Laterality Date  . BREAST ENHANCEMENT SURGERY    . FACIAL COSMETIC SURGERY    . MOUTH SURGERY     wisdom teeth  . NASAL SEPTUM SURGERY     Social History   Socioeconomic History  . Marital status: Married    Spouse name: Not on file  . Number of children: 3  . Years of education: Not on file  . Highest education level: Not on file  Social Needs  . Financial resource strain: Not on file  . Food insecurity - worry: Not on file  . Food insecurity - inability: Not on file  . Transportation needs - medical: Not on file  . Transportation needs - non-medical: Not on file  Occupational History  . Occupation: realtor  Tobacco Use  . Smoking status: Never Smoker  . Smokeless tobacco: Never Used  Substance and Sexual Activity  . Alcohol use: No    Alcohol/week: 0.0 oz  . Drug use: No  . Sexual activity: Yes    Partners: Male    Birth control/protection: Other-see comments    Comment: husband with vasectomy  Other Topics Concern  . Not on file  Social History Narrative  . Not on file   Allergies  Allergen Reactions  . Eggs Or Egg-Derived Products Other (See Comments)    "Egg whites"  . Gluten Meal Other (See Comments)   Abdominal pain  . Lactose Intolerance (Gi)    Family History  Problem Relation Age of Onset  . Colon polyps Father   . Heart disease Father   . Stroke Father   . Diabetes Mother        diet controlled  . Stroke Maternal Grandfather   . Colon cancer Neg Hx      Past medical history, social, surgical and family history all reviewed in electronic medical record.  No pertanent information unless stated regarding to the chief complaint.   Review of Systems:Review of systems updated and as accurate as of 04/10/17  No headache, visual changes, nausea, vomiting, diarrhea, constipation, dizziness, abdominal pain, skin rash, fevers, chills, night sweats, weight loss, swollen lymph nodes, body aches, joint swelling, muscle aches, chest pain, shortness of breath, mood changes.   Objective  There were no vitals taken for this visit. Systems examined below as of 04/10/17   General: No apparent distress alert and oriented x3 mood and affect normal, dressed appropriately.  HEENT: Pupils equal, extraocular movements intact  Respiratory: Patient's speak in full sentences and does not appear short of breath  Cardiovascular: No lower extremity edema, non tender, no erythema  Skin: Warm dry  intact with no signs of infection or rash on extremities or on axial skeleton.  Abdomen: Soft nontender  Neuro: Cranial nerves II through XII are intact, neurovascularly intact in all extremities with 2+ DTRs and 2+ pulses.  Lymph: No lymphadenopathy of posterior or anterior cervical chain or axillae bilaterally.  Gait normal with good balance and coordination.  MSK:  Non tender with full range of motion and good stability and symmetric strength and tone of shoulders, elbows, wrist, hip, knee and ankles bilaterally.     Impression and Recommendations:     This case required medical decision making of moderate complexity.      Note: This dictation was prepared with Dragon dictation along with smaller phrase  technology. Any transcriptional errors that result from this process are unintentional.

## 2017-04-10 NOTE — Assessment & Plan Note (Signed)
Patient was given an injection.  Tolerated procedure well.  We discussed icing regimen, which activities to do which wants to avoid.  Patient work with a Clinical research associate to learn about further shoulder.  History of hypothyroidism so encourage patient to have laboratory workup for this again.  Patient will come back and see me again in 4 weeks.  Possible need for formal physical therapy

## 2017-04-10 NOTE — Telephone Encounter (Signed)
Please advise 

## 2017-04-10 NOTE — Patient Instructions (Signed)
Good to see you  Ice 20 minutes 2 times daily. Usually after activity and before bed. Exercises 3 times a week.  pennsaid pinkie amount topically 2 times daily as needed.  Vitamin D at least 2000 IU daily  If not better on Saturday take prednisone daily for 5 days See me again in 2-3 weeks

## 2017-04-12 HISTORY — PX: COLONOSCOPY: SHX174

## 2017-04-15 NOTE — Telephone Encounter (Signed)
Allergy has been removed.

## 2017-04-16 DIAGNOSIS — Z1211 Encounter for screening for malignant neoplasm of colon: Secondary | ICD-10-CM | POA: Diagnosis not present

## 2017-04-16 DIAGNOSIS — Z8371 Family history of colonic polyps: Secondary | ICD-10-CM | POA: Diagnosis not present

## 2017-04-28 NOTE — Progress Notes (Signed)
[name] [address] [place], [place] [address] Phone: [phone] Subjective:    I'm seeing this patient by the request  of:    CC: Shoulder pain follow-up  [id number]  Anna Young is a 56 y.o. female coming in with complaint of shoulder pain.  Was seen 1 month ago and was diagnosed with frozen shoulder.  Given an injection and was discharged home exercises.  Patient states that she has been doing the exercises occasionally and they seem to help. She said that the injection and steroid medication helped. She is wanting to know the results from her xray.       Past Medical History:  Diagnosis Date  . Arthritis    of neck  . Fibroids, intramural 2014  . Hypothyroidism   . Urinary incontinence    especially with coughing and sneezing   Past Surgical History:  Procedure Laterality Date  . BREAST ENHANCEMENT SURGERY    . FACIAL COSMETIC SURGERY    . MOUTH SURGERY     wisdom teeth  . NASAL SEPTUM SURGERY     Social History   Socioeconomic History  . Marital status: Married    Spouse name: None  . Number of children: 3  . Years of education: None  . Highest education level: None  Social Needs  . Financial resource strain: None  . Food insecurity - worry: None  . Food insecurity - inability: None  . Transportation needs - medical: None  . Transportation needs - non-medical: None  Occupational History  . Occupation: realtor  Tobacco Use  . Smoking status: Never Smoker  . Smokeless tobacco: Never Used  Substance and Sexual Activity  . Alcohol use: No    Alcohol/week: 0.0 oz  . Drug use: No  . Sexual activity: Yes    Partners: Male    Birth control/protection: Other-see comments    Comment: husband with vasectomy  Other Topics Concern  . None  Social History Narrative  . None   Allergies  Allergen Reactions  . Gluten Meal Other (See Comments)    Abdominal pain  . Lactose Intolerance (Gi)    Family History  Problem  Relation Age of Onset  . Colon polyps Father   . Heart disease Father   . Stroke Father   . Diabetes Mother        diet controlled  . Stroke Maternal Grandfather   . Colon cancer Neg Hx      Past medical history, social, surgical and family history all reviewed in electronic medical record.  No pertanent information unless stated regarding to the chief complaint.   Review of Systems:Review of systems updated and as accurate as of 04/29/17  No headache, visual changes, nausea, vomiting, diarrhea, constipation, dizziness, abdominal pain, skin rash, fevers, chills, night sweats, weight loss, swollen lymph nodes, body aches, joint swelling, muscle aches, chest pain, shortness of breath, mood changes.   Objective  Blood pressure 108/66, pulse 76, height 5\' 3"  (1.6 m), weight 110 lb (49.9 kg), last menstrual period 04/02/2017, SpO2 99 %. Systems examined below as of 04/29/17   General: No apparent distress alert and oriented x3 mood and affect normal, dressed appropriately.  HEENT: Pupils equal, extraocular movements intact  Respiratory: Patient's speak in full sentences and does not appear short of breath  Cardiovascular: No lower extremity edema, non tender, no erythema  Skin: Warm dry intact with no signs of infection or rash on extremities or on axial skeleton.  Abdomen: Soft nontender  Neuro: Cranial nerves II through XII are intact, neurovascularly intact in all extremities with 2+ DTRs and 2+ pulses.  Lymph: No lymphadenopathy of posterior or anterior cervical chain or axillae bilaterally.  Gait normal with good balance and coordination.  MSK:  Non tender with full range of motion and good stability and symmetric strength and tone of  elbows, wrist, hip, knee and ankles bilaterally.  Shoulder: Right Inspection reveals no abnormalities, atrophy or asymmetry. Palpation is normal with no tenderness over AC joint or bicipital groove. Continue lack of motion but does have some  improvement.  Patient has forward flexion 160 degrees, minimal external rotation, internal rotation to sacrum Rotator cuff strength normal throughout. No signs of impingement with negative Neer and Hawkin's tests, empty can sign. Speeds and Yergason's tests normal. No labral pathology noted with negative Obrien's, negative clunk and good stability. Normal scapular function observed. No painful arc and no drop arm sign. No apprehension sign Contralateral shoulder unremarkable      Impression and Recommendations:     This case required medical decision making of moderate complexity.      Note: This dictation was prepared with Dragon dictation along with smaller phrase technology. Any transcriptional errors that result from this process are unintentional.

## 2017-04-29 ENCOUNTER — Encounter: Payer: Self-pay | Admitting: Family Medicine

## 2017-04-29 ENCOUNTER — Ambulatory Visit: Payer: BLUE CROSS/BLUE SHIELD | Admitting: Family Medicine

## 2017-04-29 DIAGNOSIS — M7501 Adhesive capsulitis of right shoulder: Secondary | ICD-10-CM | POA: Diagnosis not present

## 2017-04-29 MED ORDER — LEVOTHYROXINE SODIUM 25 MCG PO TABS: 50.0000 ug | ORAL_TABLET | Freq: Every day | ORAL | 1 refills | Status: DC

## 2017-04-29 NOTE — Patient Instructions (Addendum)
Good to see you  Ice I your friend Keep working on range of motion  Synthroid 49mcg daily in addition  See me again in 6 weeks

## 2017-04-29 NOTE — Assessment & Plan Note (Signed)
Improved, not perfect  Encouraged HEP  Declined PT  Discussed another injection but will wait .  Discussed the association with thyroid disease.  Patient has not changed medications over the course of the time.  Will start on a very low dose of synthroid.  F/u in 6 weeks potential repeat injection

## 2017-05-09 ENCOUNTER — Other Ambulatory Visit: Payer: Self-pay | Admitting: *Deleted

## 2017-05-09 MED ORDER — LEVOTHYROXINE SODIUM 25 MCG PO TABS: 50.0000 ug | ORAL_TABLET | Freq: Every day | ORAL | 0 refills | Status: DC

## 2017-05-09 NOTE — Telephone Encounter (Signed)
Refill done.  

## 2017-05-15 ENCOUNTER — Telehealth: Payer: Self-pay | Admitting: Internal Medicine

## 2017-05-15 NOTE — Telephone Encounter (Signed)
Copied from [place]. Topic: Quick Communication - See Telephone Encounter >> May 15, 2017 12:07 PM Vernona Rieger wrote: CRM for notification. See Telephone encounter for:   05/15/17.  levothyroxine (SYNTHROID, LEVOTHROID) 25 MCG tablet [id number] - [place], [place] - [address] Pt said this medicine helps.  Pt needs enough to get her by until her appt in 6 weeks with him

## 2017-05-15 NOTE — Telephone Encounter (Signed)
Called pt  Med was filled 05/09/18 for 90 days worth. Pt has upcoming appointment with PCP 06/10/17

## 2017-05-28 ENCOUNTER — Ambulatory Visit: Payer: BLUE CROSS/BLUE SHIELD | Admitting: Obstetrics and Gynecology

## 2017-05-28 ENCOUNTER — Other Ambulatory Visit: Payer: Self-pay

## 2017-05-28 ENCOUNTER — Encounter: Payer: Self-pay | Admitting: Obstetrics and Gynecology

## 2017-05-28 VITALS — BP 104/60 | HR 80 | Resp 14 | Ht 63.0 in | Wt 114.0 lb

## 2017-05-28 DIAGNOSIS — Z0000 Encounter for general adult medical examination without abnormal findings: Secondary | ICD-10-CM

## 2017-05-28 DIAGNOSIS — Z23 Encounter for immunization: Secondary | ICD-10-CM

## 2017-05-28 DIAGNOSIS — E039 Hypothyroidism, unspecified: Secondary | ICD-10-CM

## 2017-05-28 DIAGNOSIS — Z01419 Encounter for gynecological examination (general) (routine) without abnormal findings: Secondary | ICD-10-CM

## 2017-05-28 NOTE — Patient Instructions (Signed)
EXERCISE AND DIET:  We recommended that you start or continue a regular exercise program for good health. Regular exercise means any activity that makes your heart beat faster and makes you sweat.  We recommend exercising at least 30 minutes per day at least 3 days a week, preferably 4 or 5.  We also recommend a diet low in fat and sugar.  Inactivity, poor dietary choices and obesity can cause diabetes, heart attack, stroke, and kidney damage, among others.    ALCOHOL AND SMOKING:  Women should limit their alcohol intake to no more than 7 drinks/beers/glasses of wine (combined, not each!) per week. Moderation of alcohol intake to this level decreases your risk of breast cancer and liver damage. And of course, no recreational drugs are part of a healthy lifestyle.  And absolutely no smoking or even second hand smoke. Most people know smoking can cause heart and lung diseases, but did you know it also contributes to weakening of your bones? Aging of your skin?  Yellowing of your teeth and nails?  CALCIUM AND VITAMIN D:  Adequate intake of calcium and Vitamin D are recommended.  The recommendations for exact amounts of these supplements seem to change often, but generally speaking 600 mg of calcium (either carbonate or citrate) and 800 units of Vitamin D per day seems prudent. Certain women may benefit from higher intake of Vitamin D.  If you are among these women, your doctor will have told you during your visit.    PAP SMEARS:  Pap smears, to check for cervical cancer or precancers,  have traditionally been done yearly, although recent scientific advances have shown that most women can have pap smears less often.  However, every woman still should have a physical exam from her gynecologist every year. It will include a breast check, inspection of the vulva and vagina to check for abnormal growths or skin changes, a visual exam of the cervix, and then an exam to evaluate the size and shape of the uterus and  ovaries.  And after 56 years of age, a rectal exam is indicated to check for rectal cancers. We will also provide age appropriate advice regarding health maintenance, like when you should have certain vaccines, screening for sexually transmitted diseases, bone density testing, colonoscopy, mammograms, etc.   MAMMOGRAMS:  All women over 40 years old should have a yearly mammogram. Many facilities now offer a "3D" mammogram, which may cost around $50 extra out of pocket. If possible,  we recommend you accept the option to have the 3D mammogram performed.  It both reduces the number of women who will be called back for extra views which then turn out to be normal, and it is better than the routine mammogram at detecting truly abnormal areas.    COLONOSCOPY:  Colonoscopy to screen for colon cancer is recommended for all women at age 50.  We know, you hate the idea of the prep.  We agree, BUT, having colon cancer and not knowing it is worse!!  Colon cancer so often starts as a polyp that can be seen and removed at colonscopy, which can quite literally save your life!  And if your first colonoscopy is normal and you have no family history of colon cancer, most women don't have to have it again for 10 years.  Once every ten years, you can do something that may end up saving your life, right?  We will be happy to help you get it scheduled when you are ready.    Be sure to check your insurance coverage so you understand how much it will cost.  It may be covered as a preventative service at no cost, but you should check your particular policy.      Breast Self-Awareness Breast self-awareness means being familiar with how your breasts look and feel. It involves checking your breasts regularly and reporting any changes to your health care provider. Practicing breast self-awareness is important. A change in your breasts can be a sign of a serious medical problem. Being familiar with how your breasts look and feel allows  you to find any problems early, when treatment is more likely to be successful. All women should practice breast self-awareness, including women who have had breast implants. How to do a breast self-exam One way to learn what is normal for your breasts and whether your breasts are changing is to do a breast self-exam. To do a breast self-exam: Look for Changes  1. Remove all the clothing above your waist. 2. Stand in front of a mirror in a room with good lighting. 3. Put your hands on your hips. 4. Push your hands firmly downward. 5. Compare your breasts in the mirror. Look for differences between them (asymmetry), such as: ? Differences in shape. ? Differences in size. ? Puckers, dips, and bumps in one breast and not the other. 6. Look at each breast for changes in your skin, such as: ? Redness. ? Scaly areas. 7. Look for changes in your nipples, such as: ? Discharge. ? Bleeding. ? Dimpling. ? Redness. ? A change in position. Feel for Changes  Carefully feel your breasts for lumps and changes. It is best to do this while lying on your back on the floor and again while sitting or standing in the shower or tub with soapy water on your skin. Feel each breast in the following way:  Place the arm on the side of the breast you are examining above your head.  Feel your breast with the other hand.  Start in the nipple area and make  inch (2 cm) overlapping circles to feel your breast. Use the pads of your three middle fingers to do this. Apply light pressure, then medium pressure, then firm pressure. The light pressure will allow you to feel the tissue closest to the skin. The medium pressure will allow you to feel the tissue that is a little deeper. The firm pressure will allow you to feel the tissue close to the ribs.  Continue the overlapping circles, moving downward over the breast until you feel your ribs below your breast.  Move one finger-width toward the center of the body.  Continue to use the  inch (2 cm) overlapping circles to feel your breast as you move slowly up toward your collarbone.  Continue the up and down exam using all three pressures until you reach your armpit.  Write Down What You Find  Write down what is normal for each breast and any changes that you find. Keep a written record with breast changes or normal findings for each breast. By writing this information down, you do not need to depend only on memory for size, tenderness, or location. Write down where you are in your menstrual cycle, if you are still menstruating. If you are having trouble noticing differences in your breasts, do not get discouraged. With time you will become more familiar with the variations in your breasts and more comfortable with the exam. How often should I examine my breasts? Examine   your breasts every month. If you are breastfeeding, the best time to examine your breasts is after a feeding or after using a breast pump. If you menstruate, the best time to examine your breasts is 5-7 days after your period is over. During your period, your breasts are lumpier, and it may be more difficult to notice changes. When should I see my health care provider? See your health care provider if you notice:  A change in shape or size of your breasts or nipples.  A change in the skin of your breast or nipples, such as a reddened or scaly area.  Unusual discharge from your nipples.  A lump or thick area that was not there before.  Pain in your breasts.  Anything that concerns you.  This information is not intended to replace advice given to you by your health care provider. Make sure you discuss any questions you have with your health care provider. Document Released: 04/29/2005 Document Revised: 10/05/2015 Document Reviewed: 03/19/2015 Elsevier Interactive Patient Education  2018 Elsevier Inc.  

## 2017-05-28 NOTE — Progress Notes (Signed)
57 y.o. [id number] MarriedCaucasianF here for annual exam.   Cycles have been regular for the last 3 months, prior to that the longest she went without a cycle was 2 months. No significant vasomotor symptoms. Sexually active, no pain.  Period Cycle (Days): 28 Period Duration (Days): 4-5 days  Period Pattern: Regular Menstrual Flow: Moderate Menstrual Control: Thin pad, Tampon Menstrual Control Change Freq (Hours): changes pad/ tampon every 3-4 hours  Dysmenorrhea: (!) Moderate Dysmenorrhea Symptoms: Cramping  Patient's last menstrual period was 05/06/2017 (approximate).          Sexually active: Yes.    The current method of family planning is vasectomy.    Exercising: No.  The patient does not participate in regular exercise at present. Smoker:  no  Health Maintenance: Pap:  03-02-15 WNL NEG HR HPV  History of abnormal Pap:  Yes 10 yrs ago  MMG:  11-16-15 WNL 18 month DX MMG recommended Colonoscopy:  11-29-11 polyp. 12/18 normal, f/u in 10 years.  BMD:   Never TDaP:  Unsure  Gardasil: N/A   reports that  has never smoked. she has never used smokeless tobacco. She reports that she does not drink alcohol or use drugs. Husband with a h/o testicular cancer.  3 adult children, 2 grand daughter's (60 year old and newborn). 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   Past Medical History:  Diagnosis Date  . Arthritis    of neck  . Fibroids, intramural 2014  . Frozen shoulder    right shoulder  . Hypothyroidism   . Urinary incontinence    especially with coughing and sneezing    Past Surgical History:  Procedure Laterality Date  . BREAST ENHANCEMENT SURGERY    . FACIAL COSMETIC SURGERY    . MOUTH SURGERY     wisdom teeth  . NASAL SEPTUM SURGERY      Current Outpatient Medications  Medication Sig Dispense Refill  . ARMOUR THYROID 120 MG tablet Take 1 tablet by mouth daily.    . Ascorbic Acid (VITAMIN C) 100 MG tablet Take 100 mg by mouth daily.    Marland Kitchen levothyroxine  (SYNTHROID, LEVOTHROID) 25 MCG tablet Take 2 tablets (50 mcg total) by mouth daily. 180 tablet 0  . magnesium 30 MG tablet Take 30 mg by mouth daily.    . Multiple Vitamin (MULTI-VITAMINS PO) Take by mouth daily.    Marland Kitchen OVER THE COUNTER MEDICATION Vitamin D  10,000 every other day    . Probiotic Product (PROBIOTIC PO) Take by mouth daily.     No current facility-administered medications for this visit.     Family History  Problem Relation Age of Onset  . Colon polyps Father   . Heart disease Father   . Stroke Father   . Diabetes Mother        diet controlled  . Stroke Maternal Grandfather   . Colon cancer Neg Hx     Review of Systems  Constitutional: Negative.   HENT: Negative.   Eyes: Negative.   Respiratory: Negative.   Cardiovascular: Negative.   Gastrointestinal: Negative.   Endocrine: Negative.   Genitourinary: Negative.   Musculoskeletal: Negative.   Skin: Negative.   Allergic/Immunologic: Negative.   Neurological: Negative.   Psychiatric/Behavioral: Negative.     Exam:   BP 104/60 (BP Location: Right Arm, Patient Position: Sitting, Cuff Size: Normal)   Pulse 80   Resp 14   Ht 5\' 3"  (1.6 m)   Wt 114 lb (51.7 kg)  LMP 05/06/2017 (Approximate)   BMI 20.19 kg/m   Weight change: @WEIGHTCHANGE @ Height:   Height: 5\' 3"  (160 cm)  Ht Readings from Last 3 Encounters:  05/28/17 5\' 3"  (1.6 m)  04/29/17 5\' 3"  (1.6 m)  04/10/17 5\' 3"  (1.6 m)    General appearance: alert, cooperative and appears stated age Head: Normocephalic, without obvious abnormality, atraumatic Neck: no adenopathy, supple, symmetrical, trachea midline and thyroid normal to inspection and palpation Lungs: clear to auscultation bilaterally Cardiovascular: regular rate and rhythm Breasts: normal appearance, no masses or tenderness Abdomen: soft, non-tender; non distended,  no masses,  no organomegaly Extremities: extremities normal, atraumatic, no cyanosis or edema Skin: Skin color, texture, turgor  normal. No rashes or lesions Lymph nodes: Cervical, supraclavicular, and axillary nodes normal. No abnormal inguinal nodes palpated Neurologic: Grossly normal   Pelvic: External genitalia:  no lesions              Urethra:  normal appearing urethra with no masses, tenderness or lesions              Bartholins and Skenes: normal                 Vagina: normal appearing vagina with normal color and discharge, no lesions              Cervix: no lesions               Bimanual Exam:  Uterus:  normal size, contour, position, consistency, mobility, non-tender              Adnexa: no mass, fullness, tenderness               Rectovaginal: Confirms               Anus:  normal sphincter tone, no lesions  Chaperone was present for exam.  A:  Well Woman with normal exam  P:   Pap next year  Mammogram due  Screening labs (TSH is with her naturopath)  Discussed breast self exam  Discussed calcium and vit D intake  Colonoscopy UTD  Call if she goes 2 months with out a cycle.

## 2017-05-28 NOTE — Addendum Note (Signed)
Addended by: Terence Lux A on: 05/28/2017 02:09 PM   Modules accepted: Orders

## 2017-05-29 LAB — LIPID PANEL
CHOLESTEROL TOTAL: 143 mg/dL (ref 100–199)
Chol/HDL Ratio: 2.6 ratio (ref 0.0–4.4)
HDL: 56 mg/dL (ref >39)
LDL Calculated: 70 mg/dL (ref 0–99)
TRIGLYCERIDES: 84 mg/dL (ref 0–149)
VLDL Cholesterol Cal: 17 mg/dL (ref 5–40)

## 2017-05-29 LAB — CBC
HEMATOCRIT: 38.2 % (ref 34.0–46.6)
Hemoglobin: 13 g/dL (ref 11.1–15.9)
MCH: 30 pg (ref 26.6–33.0)
MCHC: 34 g/dL (ref 31.5–35.7)
MCV: 88 fL (ref 79–97)
PLATELETS: 309 10*3/uL (ref 150–379)
RBC: 4.34 x10E6/uL (ref 3.77–5.28)
RDW: 13.2 % (ref 12.3–15.4)
WBC: 7.4 10*3/uL (ref 3.4–10.8)

## 2017-05-29 LAB — TSH: TSH: <0.006 u[IU]/mL — ABNORMAL LOW (ref 0.450–4.500)

## 2017-05-29 LAB — COMPREHENSIVE METABOLIC PANEL
A/G RATIO: 2 (ref 1.2–2.2)
ALK PHOS: 67 IU/L (ref 39–117)
ALT: 10 IU/L (ref 0–32)
AST: 16 IU/L (ref 0–40)
Albumin: 4.3 g/dL (ref 3.5–5.5)
BUN/Creatinine Ratio: 28 — ABNORMAL HIGH (ref 9–23)
BUN: 15 mg/dL (ref 6–24)
Bilirubin Total: <0.2 mg/dL (ref 0.0–1.2)
CHLORIDE: 99 mmol/L (ref 96–106)
CO2: 27 mmol/L (ref 20–29)
Calcium: 9.8 mg/dL (ref 8.7–10.2)
Creatinine, Ser: 0.54 mg/dL — ABNORMAL LOW (ref 0.57–1.00)
GFR calc Af Amer: 123 mL/min/{1.73_m2} (ref >59)
GFR calc non Af Amer: 107 mL/min/{1.73_m2} (ref >59)
GLOBULIN, TOTAL: 2.2 g/dL (ref 1.5–4.5)
Glucose: 85 mg/dL (ref 65–99)
Potassium: 4.1 mmol/L (ref 3.5–5.2)
SODIUM: 142 mmol/L (ref 134–144)
Total Protein: 6.5 g/dL (ref 6.0–8.5)

## 2017-05-30 ENCOUNTER — Telehealth: Payer: Self-pay | Admitting: *Deleted

## 2017-05-30 DIAGNOSIS — Z1231 Encounter for screening mammogram for malignant neoplasm of breast: Secondary | ICD-10-CM | POA: Diagnosis not present

## 2017-05-30 NOTE — Telephone Encounter (Signed)
Patient returned call. Results reviewed with patient as seen below from Dr. Talbert Nan and patient verbalized understanding.   Patient agreeable to disposition. Will close encounter.

## 2017-05-30 NOTE — Telephone Encounter (Signed)
-----   Message from Salvadore Dom, MD sent at 05/29/2017 12:38 PM EST ----- Please let the patient know that her TSH is low, this means that her thyroid is over suppressed and she is on too much medication. I would encourage her to establish care with an internist or family practice MD to help her figure out the correct medication and dosing for her thyroid (currently being treated by a Naturopath). The rest of her lab work is great.

## 2017-05-30 NOTE — Telephone Encounter (Signed)
Message left to return call to Demetrius Barrell at [phone].    

## 2017-06-10 ENCOUNTER — Encounter: Payer: Self-pay | Admitting: Family Medicine

## 2017-06-10 ENCOUNTER — Ambulatory Visit: Payer: BLUE CROSS/BLUE SHIELD | Admitting: Family Medicine

## 2017-06-10 ENCOUNTER — Ambulatory Visit: Payer: Self-pay

## 2017-06-10 VITALS — BP 108/70 | HR 84 | Ht 63.0 in | Wt 119.0 lb

## 2017-06-10 DIAGNOSIS — M2550 Pain in unspecified joint: Secondary | ICD-10-CM

## 2017-06-10 DIAGNOSIS — M7501 Adhesive capsulitis of right shoulder: Secondary | ICD-10-CM

## 2017-06-10 MED ORDER — THYROID 90 MG PO TABS: 90.0000 mg | ORAL_TABLET | Freq: Every day | ORAL | 1 refills | Status: DC

## 2017-06-10 NOTE — Assessment & Plan Note (Signed)
Repeat injection given today.  Thyroid could still be playing a function.  We will retest thyroid function later.  Patient was given Armour at a lower dose.  Continue with the Synthroid.  Discussed icing regimen.  Follow-up with me again 4 weeks.  Worsening symptoms MRI arthrogram will be necessary.

## 2017-06-10 NOTE — Progress Notes (Signed)
[name] [place], [place] [address] Phone: [phone] Subjective:     CC: Neck pain shoulder pain follow-up  [id number]  Anna Young is a 57 y.o. female coming in with complaint of neck pain.  Found to have degenerative disc disease.  Has responded well to osteopathic manipulation.  Patient was also found to have frozen shoulder.  Was making progress with conservative therapy.  We did start of low-dose of Synthroid in addition to her Armour.  Patient was to continue conservative therapy.  Patient states that she has not been doing her exercises and feels worse than last visit. Her range of motion has decreased in her right shoulder. She does feel that the synthroid helped and that the armour is a little high and needs adjusting.       Past Medical History:  Diagnosis Date  . Arthritis    of neck  . Fibroids, intramural 2014  . Frozen shoulder    right shoulder  . Hypothyroidism   . Urinary incontinence    especially with coughing and sneezing   Past Surgical History:  Procedure Laterality Date  . BREAST ENHANCEMENT SURGERY    . FACIAL COSMETIC SURGERY    . MOUTH SURGERY     wisdom teeth  . NASAL SEPTUM SURGERY     Social History   Socioeconomic History  . Marital status: Married    Spouse name: None  . Number of children: 3  . Years of education: None  . Highest education level: None  Social Needs  . Financial resource strain: None  . Food insecurity - worry: None  . Food insecurity - inability: None  . Transportation needs - medical: None  . Transportation needs - non-medical: None  Occupational History  . Occupation: realtor  Tobacco Use  . Smoking status: Never Smoker  . Smokeless tobacco: Never Used  Substance and Sexual Activity  . Alcohol use: No    Alcohol/week: 0.0 oz  . Drug use: No  . Sexual activity: Yes    Partners: Male    Birth control/protection: Other-see comments    Comment: husband with  vasectomy  Other Topics Concern  . None  Social History Narrative  . None   Allergies  Allergen Reactions  . Gluten Meal Other (See Comments)    Abdominal pain  . Lactose Intolerance (Gi)    Family History  Problem Relation Age of Onset  . Colon polyps Father   . Heart disease Father   . Stroke Father   . Diabetes Mother        diet controlled  . Stroke Maternal Grandfather   . Colon cancer Neg Hx      Past medical history, social, surgical and family history all reviewed in electronic medical record.  No pertanent information unless stated regarding to the chief complaint.   Review of Systems:Review of systems updated and as accurate as of 06/10/17  No visual changes, nausea, vomiting, diarrhea, constipation, dizziness, abdominal pain, skin rash, fevers, chills, night sweats, weight loss, swollen lymph nodes, body aches, joint swelling, chest pain, shortness of breath, mood changes. Positive muscle aches and headaches   Objective  Blood pressure 108/70, pulse 84, height 5\' 3"  (1.6 m), weight 119 lb (54 kg), SpO2 99 %. Systems examined below as of 06/10/17   General: No apparent distress alert and oriented x3 mood and affect normal, dressed appropriately.  HEENT: Pupils equal, extraocular movements intact  Respiratory: Patient's speak in full  sentences and does not appear short of breath  Cardiovascular: No lower extremity edema, non tender, no erythema  Skin: Warm dry intact with no signs of infection or rash on extremities or on axial skeleton.  Abdomen: Soft nontender  Neuro: Cranial nerves II through XII are intact, neurovascularly intact in all extremities with 2+ DTRs and 2+ pulses.  Lymph: No lymphadenopathy of posterior or anterior cervical chain or axillae bilaterally.  Gait normal with good balance and coordination.  MSK:  Non tender with full range of motion and good stability and symmetric strength and tone of  elbows, wrist, hip, knee and ankles bilaterally.    Right shoulder shows significant decrease in range of motion.  Patient now lacking the last 40 degrees of forward flexion.  Has minimal external rotation.  Internal rotation to sacrum.  Significantly less than previous exam.  Full strength though still noted.  Positive impingement  Contralateral shoulder unremarkable.  Procedure: Real-time Ultrasound Guided Injection of right glenohumeral joint Device: GE Logiq Q7  Ultrasound guided injection is preferred based studies that show increased duration, increased effect, greater accuracy, decreased procedural pain, increased response rate with ultrasound guided versus blind injection.  Verbal informed consent obtained.  Time-out conducted.  Noted no overlying erythema, induration, or other signs of local infection.  Skin prepped in a sterile fashion.  Local anesthesia: Topical Ethyl chloride.  With sterile technique and under real time ultrasound guidance:  Joint visualized.  23g 1  inch needle inserted posterior approach. Pictures taken for needle placement. Patient did have injection of 2 cc of 1% lidocaine, 2 cc of 0.5% Marcaine, and 1.0 cc of Kenalog 40 mg/dL. Completed without difficulty  Pain immediately resolved suggesting accurate placement of the medication.  Advised to call if fevers/chills, erythema, induration, drainage, or persistent bleeding.  Images permanently stored and available for review in the ultrasound unit.  Impression: Technically successful ultrasound guided injection.     Impression and Recommendations:     This case required medical decision making of moderate complexity.      Note: This dictation was prepared with Dragon dictation along with smaller phrase technology. Any transcriptional errors that result from this process are unintentional.

## 2017-06-10 NOTE — Patient Instructions (Addendum)
Good to see you  Armour 90mg  and lets see  Recheck values in 6-8 weeks  Keep working on the shoulder If no improvement in 10 days we may need to consider MRI of the shoulder See me again  In 6-8 weeks for the neck

## 2017-06-17 ENCOUNTER — Encounter: Payer: Self-pay | Admitting: Obstetrics and Gynecology

## 2017-06-23 ENCOUNTER — Telehealth: Payer: Self-pay | Admitting: Family Medicine

## 2017-06-23 ENCOUNTER — Other Ambulatory Visit: Payer: Self-pay

## 2017-06-23 MED ORDER — LEVOTHYROXINE SODIUM 25 MCG PO TABS: 50.0000 ug | ORAL_TABLET | Freq: Every day | ORAL | 0 refills | Status: DC

## 2017-06-23 NOTE — Telephone Encounter (Signed)
Copied from [place]. Topic: Quick Communication - Rx Refill/Question >> Jun 23, 2017  8:31 AM Synthia Innocent wrote: Medication: thyroid (ARMOUR) 90 MG tablet, requesting 60mg     Has the patient contacted their pharmacy? No, dose change   (Agent: If no, request that the patient contact the pharmacy for the refill.)   Preferred Pharmacy (with phone number or street name): [facility]   Agent: Please be advised that RX refills may take up to 3 business days. We ask that you follow-up with your pharmacy.

## 2017-06-23 NOTE — Telephone Encounter (Signed)
Refill filled per Dr. Tamala Julian.

## 2017-06-23 NOTE — Telephone Encounter (Signed)
Amour  refill Last OV:1/29/19Last Refill 06/10/17 Pharmacy: [facility]   I could not find anyone at office treating this patient other than Dr. Tamala Julian.

## 2017-07-29 NOTE — Progress Notes (Signed)
[name] [place], [place] [address] Phone: [phone] Subjective:    I'm seeing this patient by the request  of:    CC: Shoulder pain follow-up, neck pain follow-up  [id number]  Anna Young is a 57 y.o. female coming in with complaint of shoulder pain. She is doing much better than last visit. She does try to do the exercises and uses a hot tub to help with stretching.  Patient was seen previously and did do fairly well with conservative therapy and injections.  Patient states that she is feeling 98% better.  Still has some mild limitation in certain movements such as forward flexion.  Patient also has neck pain.  Known to have degenerative disc disease.  Has started manipulation previously and was doing fairly well.  Patient has made some improvement with her thyroid medications as well.      Past Medical History:  Diagnosis Date  . Arthritis    of neck  . Fibroids, intramural 2014  . Frozen shoulder    right shoulder  . Hypothyroidism   . Urinary incontinence    especially with coughing and sneezing   Past Surgical History:  Procedure Laterality Date  . BREAST ENHANCEMENT SURGERY    . FACIAL COSMETIC SURGERY    . MOUTH SURGERY     wisdom teeth  . NASAL SEPTUM SURGERY     Social History   Socioeconomic History  . Marital status: Married    Spouse name: None  . Number of children: 3  . Years of education: None  . Highest education level: None  Social Needs  . Financial resource strain: None  . Food insecurity - worry: None  . Food insecurity - inability: None  . Transportation needs - medical: None  . Transportation needs - non-medical: None  Occupational History  . Occupation: realtor  Tobacco Use  . Smoking status: Never Smoker  . Smokeless tobacco: Never Used  Substance and Sexual Activity  . Alcohol use: No    Alcohol/week: 0.0 oz  . Drug use: No  . Sexual activity: Yes    Partners: Male    Birth  control/protection: Other-see comments    Comment: husband with vasectomy  Other Topics Concern  . None  Social History Narrative  . None   Allergies  Allergen Reactions  . Gluten Meal Other (See Comments)    Abdominal pain  . Lactose Intolerance (Gi)    Family History  Problem Relation Age of Onset  . Colon polyps Father   . Heart disease Father   . Stroke Father   . Diabetes Mother        diet controlled  . Stroke Maternal Grandfather   . Colon cancer Neg Hx      Past medical history, social, surgical and family history all reviewed in electronic medical record.  No pertanent information unless stated regarding to the chief complaint.   Review of Systems:Review of systems updated and as accurate as of 07/30/17  No headache, visual changes, nausea, vomiting, diarrhea, constipation, dizziness, abdominal pain, skin rash, fevers, chills, night sweats, weight loss, swollen lymph nodes, body aches, joint swelling, chest pain, shortness of breath, mood changes.  Positive muscle aches  Objective  Blood pressure 110/70, pulse 82, height 5\' 3"  (1.6 m), weight 112 lb (50.8 kg), SpO2 98 %. Systems examined below as of 07/30/17   General: No apparent distress alert and oriented x3 mood and affect normal, dressed appropriately.  HEENT:  Pupils equal, extraocular movements intact  Respiratory: Patient's speak in full sentences and does not appear short of breath  Cardiovascular: No lower extremity edema, non tender, no erythema  Skin: Warm dry intact with no signs of infection or rash on extremities or on axial skeleton.  Abdomen: Soft nontender  Neuro: Cranial nerves II through XII are intact, neurovascularly intact in all extremities with 2+ DTRs and 2+ pulses.  Lymph: No lymphadenopathy of posterior or anterior cervical chain or axillae bilaterally.  Gait normal with good balance and coordination.  MSK:  Non tender with full range of motion and good stability and symmetric strength  and tone of  elbows, wrist, hip, knee and ankles bilaterally.  Right shoulder has almost full range of motion lacking last 2-5 degrees of forward flexion lacking the last 5 degrees of external rotation.  Negative impingement.  Full strength of the rotator cuff. Neck exam shows limitation in sidebending bilaterally.  Mild tightness of the musculature.  No radiation of the hands for grip strength.  Osteopathic findings C2 flexed rotated and side bent right C4 flexed rotated and side bent left C7 flexed rotated and side bent left T3 extended rotated and side bent right inhaled third rib L5 flexed rotated and side bent right     Impression and Recommendations:     This case required medical decision making of moderate complexity.      Note: This dictation was prepared with Dragon dictation along with smaller phrase technology. Any transcriptional errors that result from this process are unintentional.

## 2017-07-30 ENCOUNTER — Encounter: Payer: Self-pay | Admitting: Family Medicine

## 2017-07-30 ENCOUNTER — Ambulatory Visit: Payer: BLUE CROSS/BLUE SHIELD | Admitting: Family Medicine

## 2017-07-30 ENCOUNTER — Other Ambulatory Visit (INDEPENDENT_AMBULATORY_CARE_PROVIDER_SITE_OTHER): Payer: BLUE CROSS/BLUE SHIELD

## 2017-07-30 VITALS — BP 110/70 | HR 82 | Ht 63.0 in | Wt 112.0 lb

## 2017-07-30 DIAGNOSIS — M47892 Other spondylosis, cervical region: Secondary | ICD-10-CM

## 2017-07-30 DIAGNOSIS — M2550 Pain in unspecified joint: Secondary | ICD-10-CM | POA: Diagnosis not present

## 2017-07-30 DIAGNOSIS — M999 Biomechanical lesion, unspecified: Secondary | ICD-10-CM

## 2017-07-30 DIAGNOSIS — M7501 Adhesive capsulitis of right shoulder: Secondary | ICD-10-CM | POA: Diagnosis not present

## 2017-07-30 LAB — T4, FREE: Free T4: 1.12 ng/dL (ref 0.60–1.60)

## 2017-07-30 LAB — T3, FREE: T3 FREE: 5.1 pg/mL — AB (ref 2.3–4.2)

## 2017-07-30 LAB — TSH

## 2017-07-30 NOTE — Assessment & Plan Note (Signed)
Has done remarkably well at this time.  Discussed icing regimen and home exercises.  We will see how patient responds to osteopathic manipulation.  Follow-up again in 6-8 weeks.

## 2017-07-30 NOTE — Assessment & Plan Note (Signed)
Decision today to treat with OMT was based on Physical Exam  After verbal consent patient was treated with HVLA, ME, FPR techniques in cervical, thoracic, lumbar and sacral areas  Patient tolerated the procedure well with improvement in symptoms  Patient given exercises, stretches and lifestyle modifications  See medications in patient instructions if given  Patient will follow up in 6-8 weeks 

## 2017-07-30 NOTE — Assessment & Plan Note (Signed)
Stable and improved 

## 2017-07-30 NOTE — Patient Instructions (Signed)
Good to see you  Alvera Singh is your friend.  Keep working on posture and watch the monitors and where they are  Keep hands within peripheral vision  The shoulder is great  Started manipulation again  Lab today  See me again in 6-8 weeks

## 2017-08-04 DIAGNOSIS — L814 Other melanin hyperpigmentation: Secondary | ICD-10-CM | POA: Diagnosis not present

## 2017-08-04 DIAGNOSIS — D1801 Hemangioma of skin and subcutaneous tissue: Secondary | ICD-10-CM | POA: Diagnosis not present

## 2017-08-04 DIAGNOSIS — D485 Neoplasm of uncertain behavior of skin: Secondary | ICD-10-CM | POA: Diagnosis not present

## 2017-08-04 DIAGNOSIS — D225 Melanocytic nevi of trunk: Secondary | ICD-10-CM | POA: Diagnosis not present

## 2017-08-04 DIAGNOSIS — L821 Other seborrheic keratosis: Secondary | ICD-10-CM | POA: Diagnosis not present

## 2017-08-14 ENCOUNTER — Other Ambulatory Visit: Payer: Self-pay

## 2017-08-14 ENCOUNTER — Telehealth: Payer: Self-pay | Admitting: Family Medicine

## 2017-08-14 MED ORDER — THYROID 60 MG PO TABS: 60.0000 mg | ORAL_TABLET | Freq: Every day | ORAL | 1 refills | Status: DC

## 2017-08-14 NOTE — Telephone Encounter (Signed)
Rx corrected per a verbal from Dr. Tamala Julian.

## 2017-08-14 NOTE — Telephone Encounter (Signed)
Copied from [place] [id number]. Topic: General - Other >> Aug 14, 2017  3:25 PM Cecelia Byars, NT wrote: Reason for CRM: The patient called and said the  thyroid (ARMOUR) 90 MG tablet , should have been called in for 60 mg instead of the 90 please advise

## 2017-09-06 ENCOUNTER — Other Ambulatory Visit: Payer: Self-pay | Admitting: Family Medicine

## 2017-09-08 NOTE — Telephone Encounter (Signed)
Refill done.  

## 2017-09-23 NOTE — Progress Notes (Signed)
[name] [place],  [address] Phone: [phone] Subjective:     CC: Neck and back pain  [id number]  Anna Young is a 57 y.o. female coming in with complaint of neck pain. She has had a few headaches since last visit due to neck tightness and pain.        Past Medical History:  Diagnosis Date  . Arthritis    of neck  . Fibroids, intramural 2014  . Frozen shoulder    right shoulder  . Hypothyroidism   . Urinary incontinence    especially with coughing and sneezing   Past Surgical History:  Procedure Laterality Date  . BREAST ENHANCEMENT SURGERY    . FACIAL COSMETIC SURGERY    . MOUTH SURGERY     wisdom teeth  . NASAL SEPTUM SURGERY     Social History   Socioeconomic History  . Marital status: Married    Spouse name: Not on file  . Number of children: 3  . Years of education: Not on file  . Highest education level: Not on file  Occupational History  . Occupation: Cabin crew  Social Needs  . Financial resource strain: Not on file  . Food insecurity:    Worry: Not on file    Inability: Not on file  . Transportation needs:    Medical: Not on file    Non-medical: Not on file  Tobacco Use  . Smoking status: Never Smoker  . Smokeless tobacco: Never Used  Substance and Sexual Activity  . Alcohol use: No    Alcohol/week: 0.0 oz  . Drug use: No  . Sexual activity: Yes    Partners: Male    Birth control/protection: Other-see comments    Comment: husband with vasectomy  Lifestyle  . Physical activity:    Days per week: Not on file    Minutes per session: Not on file  . Stress: Not on file  Relationships  . Social connections:    Talks on phone: Not on file    Gets together: Not on file    Attends religious service: Not on file    Active member of club or organization: Not on file    Attends meetings of clubs or organizations: Not on file    Relationship status: Not on file  Other Topics Concern  .  Not on file  Social History Narrative  . Not on file   Allergies  Allergen Reactions  . Gluten Meal Other (See Comments)    Abdominal pain  . Lactose Intolerance (Gi)    Family History  Problem Relation Age of Onset  . Colon polyps Father   . Heart disease Father   . Stroke Father   . Diabetes Mother        diet controlled  . Stroke Maternal Grandfather   . Colon cancer Neg Hx      Past medical history, social, surgical and family history all reviewed in electronic medical record.  No pertanent information unless stated regarding to the chief complaint.   Review of Systems:Review of systems updated and as accurate as of 09/24/17  No  visual changes, nausea, vomiting, diarrhea, constipation, dizziness, abdominal pain, skin rash, fevers, chills, night sweats, weight loss, swollen lymph nodes, body aches, joint swelling, , chest pain, shortness of breath, mood changes.  Positive muscle aches, headache  Objective  Blood pressure 98/62, pulse 77, height 5\' 3"  (1.6 m), weight 115 lb (52.2 kg), SpO2 98 %.  Systems examined below as of 09/24/17   General: No apparent distress alert and oriented x3 mood and affect normal, dressed appropriately.  HEENT: Pupils equal, extraocular movements intact  Respiratory: Patient's speak in full sentences and does not appear short of breath  Cardiovascular: No lower extremity edema, non tender, no erythema  Skin: Warm dry intact with no signs of infection or rash on extremities or on axial skeleton.  Abdomen: Soft nontender  Neuro: Cranial nerves II through XII are intact, neurovascularly intact in all extremities with 2+ DTRs and 2+ pulses.  Lymph: No lymphadenopathy of posterior or anterior cervical chain or axillae bilaterally.  Gait normal with good balance and coordination.  MSK:  Non tender with full range of motion and good stability and symmetric strength and tone of shoulders, elbows, wrist, hip, knee and ankles bilaterally.    Neck exam  shows the patient does have hypermobility.  Some mild tightness with left-sided rotation and side bending  Osteopathic findings C2 flexed rotated and side bent left  C4 flexed rotated and side bent left T3 extended rotated and side bent right inhaled third rib T6 extended rotated and side bent left L3 flexed rotated and side bent right Sacrum right on right       Impression and Recommendations:     This case required medical decision making of moderate complexity.      Note: This dictation was prepared with Dragon dictation along with smaller phrase technology. Any transcriptional errors that result from this process are unintentional.

## 2017-09-24 ENCOUNTER — Ambulatory Visit: Payer: BLUE CROSS/BLUE SHIELD | Admitting: Family Medicine

## 2017-09-24 ENCOUNTER — Encounter: Payer: Self-pay | Admitting: Family Medicine

## 2017-09-24 VITALS — BP 98/62 | HR 77 | Ht 63.0 in | Wt 115.0 lb

## 2017-09-24 DIAGNOSIS — M999 Biomechanical lesion, unspecified: Secondary | ICD-10-CM

## 2017-09-24 DIAGNOSIS — M47892 Other spondylosis, cervical region: Secondary | ICD-10-CM | POA: Diagnosis not present

## 2017-09-24 MED ORDER — THYROID 90 MG PO TABS: 90.0000 mg | ORAL_TABLET | Freq: Every day | ORAL | 1 refills | Status: DC

## 2017-09-24 NOTE — Assessment & Plan Note (Signed)
Decision today to treat with OMT was based on Physical Exam  After verbal consent patient was treated with HVLA, ME, FPR techniques in cervical, thoracic, rib,  lumbar and sacral areas  Patient tolerated the procedure well with improvement in symptoms  Patient given exercises, stretches and lifestyle modifications  See medications in patient instructions if given  Patient will follow up in 4-8 weeks 

## 2017-09-24 NOTE — Patient Instructions (Addendum)
Good to see you  Anna Young is your friend.  Stay active We will recheck thyroid again in 2 months See you in 2 months

## 2017-09-24 NOTE — Assessment & Plan Note (Signed)
Degenerative disease.  Doing well with conservative therapy.  Discussed icing regimen and home exercise.  Discussed which activities to do which wants to avoid.  Continue same regimen and follow-up in 2 months

## 2017-10-09 ENCOUNTER — Other Ambulatory Visit: Payer: Self-pay | Admitting: Family Medicine

## 2017-10-09 NOTE — Telephone Encounter (Signed)
Refill done.  

## 2017-10-22 ENCOUNTER — Encounter: Payer: Self-pay | Admitting: Family Medicine

## 2017-11-10 ENCOUNTER — Other Ambulatory Visit: Payer: Self-pay | Admitting: Family Medicine

## 2017-11-10 NOTE — Telephone Encounter (Signed)
Refill done.  

## 2017-11-18 NOTE — Progress Notes (Signed)
[name] [address], [place] [address] Phone: [phone] Subjective:     CC: Neck pain follow-up  HQP:[id number]  Anna Young is a 57 y.o. female coming in with complaint of neck pain. Known OA.  Patient has been doing much better.  Has been making changes to her thyroid medications and states that she is feeling better overall.  Patient states that nothing is stopping her from activity.  Some tightness sometimes in the morning.    Past Medical History:  Diagnosis Date  . Arthritis    of neck  . Fibroids, intramural 2014  . Frozen shoulder    right shoulder  . Hypothyroidism   . Urinary incontinence    especially with coughing and sneezing   Past Surgical History:  Procedure Laterality Date  . BREAST ENHANCEMENT SURGERY    . FACIAL COSMETIC SURGERY    . MOUTH SURGERY     wisdom teeth  . NASAL SEPTUM SURGERY     Social History   Socioeconomic History  . Marital status: Married    Spouse name: Not on file  . Number of children: 3  . Years of education: Not on file  . Highest education level: Not on file  Occupational History  . Occupation: Cabin crew  Social Needs  . Financial resource strain: Not on file  . Food insecurity:    Worry: Not on file    Inability: Not on file  . Transportation needs:    Medical: Not on file    Non-medical: Not on file  Tobacco Use  . Smoking status: Never Smoker  . Smokeless tobacco: Never Used  Substance and Sexual Activity  . Alcohol use: No    Alcohol/week: 0.0 oz  . Drug use: No  . Sexual activity: Yes    Partners: Male    Birth control/protection: Other-see comments    Comment: husband with vasectomy  Lifestyle  . Physical activity:    Days per week: Not on file    Minutes per session: Not on file  . Stress: Not on file  Relationships  . Social connections:    Talks on phone: Not on file    Gets together: Not on file    Attends religious service: Not on file    Active  member of club or organization: Not on file    Attends meetings of clubs or organizations: Not on file    Relationship status: Not on file  Other Topics Concern  . Not on file  Social History Narrative  . Not on file   Allergies  Allergen Reactions  . Gluten Meal Other (See Comments)    Abdominal pain  . Lactose Intolerance (Gi)    Family History  Problem Relation Age of Onset  . Colon polyps Father   . Heart disease Father   . Stroke Father   . Diabetes Mother        diet controlled  . Stroke Maternal Grandfather   . Colon cancer Neg Hx      Past medical history, social, surgical and family history all reviewed in electronic medical record.  No pertanent information unless stated regarding to the chief complaint.   Review of Systems:Review of systems updated and as accurate as of 11/19/17  No headache, visual changes, nausea, vomiting, diarrhea, constipation, dizziness, abdominal pain, skin rash, fevers, chills, night sweats, weight loss, swollen lymph nodes, body aches, joint swelling, muscle aches, chest pain, shortness of breath, mood changes.  Objective  Blood pressure 100/70, pulse 84, height 5\' 3"  (1.6 m), weight 114 lb (51.7 kg), SpO2 98 %. Systems examined below as of 11/19/17   General: No apparent distress alert and oriented x3 mood and affect normal, dressed appropriately.  HEENT: Pupils equal, extraocular movements intact  Respiratory: Patient's speak in full sentences and does not appear short of breath  Cardiovascular: No lower extremity edema, non tender, no erythema  Skin: Warm dry intact with no signs of infection or rash on extremities or on axial skeleton.  Abdomen: Soft nontender  Neuro: Cranial nerves II through XII are intact, neurovascularly intact in all extremities with 2+ DTRs and 2+ pulses.  Lymph: No lymphadenopathy of posterior or anterior cervical chain or axillae bilaterally.  Gait normal with good balance and coordination.  MSK:  Non  tender with full range of motion and good stability and symmetric strength and tone of shoulders, elbows, wrist, hip, knee and ankles bilaterally.  Neck: Inspection loss of lordosis. No palpable stepoffs. Negative Spurling's maneuver. Mild loss of range of motion of the neck 5 to 10 degrees Grip strength and sensation normal in bilateral hands Strength good C4 to T1 distribution No sensory change to C4 to T1 Negative Hoffman sign bilaterally Reflexes normal Trapezius muscle tightness   Osteopathic findings C2 flexed rotated and side bent right C4 flexed rotated and side bent left T3 extended rotated and side bent right inhaled third rib T6 extended rotated and side bent left L4 flexed rotated and side bent right Sacrum right on right     Impression and Recommendations:     This case required medical decision making of moderate complexity.      Note: This dictation was prepared with Dragon dictation along with smaller phrase technology. Any transcriptional errors that result from this process are unintentional.

## 2017-11-19 ENCOUNTER — Encounter: Payer: Self-pay | Admitting: Family Medicine

## 2017-11-19 ENCOUNTER — Ambulatory Visit: Payer: BLUE CROSS/BLUE SHIELD | Admitting: Family Medicine

## 2017-11-19 VITALS — BP 100/70 | HR 84 | Ht 63.0 in | Wt 114.0 lb

## 2017-11-19 DIAGNOSIS — M999 Biomechanical lesion, unspecified: Secondary | ICD-10-CM

## 2017-11-19 DIAGNOSIS — M47892 Other spondylosis, cervical region: Secondary | ICD-10-CM

## 2017-11-19 MED ORDER — LEVOTHYROXINE SODIUM 50 MCG PO TABS: 50.0000 ug | ORAL_TABLET | Freq: Every day | ORAL | 3 refills | Status: DC

## 2017-11-19 MED ORDER — THYROID 60 MG PO TABS: 60.0000 mg | ORAL_TABLET | Freq: Every day | ORAL | 1 refills | Status: DC

## 2017-11-19 NOTE — Assessment & Plan Note (Signed)
Stable.  Responding well to the manipulation.  Made some adjustments on patient's thyroid medications and hopefully will be beneficial again.  Discussed icing regimen, discussed home exercise, discussed which activities to doing which wants to avoid.  Follow-up again in 4 to 8 weeks

## 2017-11-19 NOTE — Patient Instructions (Addendum)
Good to see you  I am hoping we will find the sweet spot.  Drop armour to 60mg   Synthroid now to 46mcg and lets see how it goes.  See me again in 8 weeks

## 2017-11-19 NOTE — Assessment & Plan Note (Signed)
Decision today to treat with OMT was based on Physical Exam  After verbal consent patient was treated with HVLA, ME, FPR techniques in cervical, thoracic, rib lumbar and sacral areas  Patient tolerated the procedure well with improvement in symptoms  Patient given exercises, stretches and lifestyle modifications  See medications in patient instructions if given  Patient will follow up in 4-8 weeks 

## 2017-12-04 ENCOUNTER — Other Ambulatory Visit: Payer: Self-pay | Admitting: Family Medicine

## 2017-12-04 NOTE — Telephone Encounter (Signed)
Refill denied. Pt should be taking armour thyroid 60mg .

## 2018-01-15 ENCOUNTER — Ambulatory Visit: Payer: BLUE CROSS/BLUE SHIELD | Admitting: Family Medicine

## 2018-01-15 ENCOUNTER — Encounter: Payer: Self-pay | Admitting: Family Medicine

## 2018-01-15 VITALS — BP 100/60 | HR 73 | Ht 63.0 in | Wt 118.0 lb

## 2018-01-15 DIAGNOSIS — M999 Biomechanical lesion, unspecified: Secondary | ICD-10-CM | POA: Diagnosis not present

## 2018-01-15 DIAGNOSIS — M47892 Other spondylosis, cervical region: Secondary | ICD-10-CM

## 2018-01-15 NOTE — Assessment & Plan Note (Signed)
Decision today to treat with OMT was based on Physical Exam  After verbal consent patient was treated with HVLA, ME, FPR techniques in cervical, thoracic, lumbar and sacral areas  Patient tolerated the procedure well with improvement in symptoms  Patient given exercises, stretches and lifestyle modifications  See medications in patient instructions if given  Patient will follow up in 6-12 weeks 

## 2018-01-15 NOTE — Patient Instructions (Signed)
Good to see you  Ice is your friend Stay active Keep trucking along See me again in 6-8 weeks

## 2018-01-15 NOTE — Progress Notes (Signed)
[name] [address] [place], [place] [address] Phone: [phone] Subjective:     I Anna Young am serving as a Education administrator for Dr. Hulan Saas.   CC: Neck pain follow-up  QIO:[id number]  Anna Young is a 57 y.o. female coming in with complaint of neck pain. States that the neck is doing well today.  Known moderate arthritic changes.  Has been doing relatively well though.  Patient's thyroid she feels as well controlled at the moment.  Feels like she is having good energy.  No significant discomfort and pain.      Past Medical History:  Diagnosis Date  . Arthritis    of neck  . Fibroids, intramural 2014  . Frozen shoulder    right shoulder  . Hypothyroidism   . Urinary incontinence    especially with coughing and sneezing   Past Surgical History:  Procedure Laterality Date  . BREAST ENHANCEMENT SURGERY    . FACIAL COSMETIC SURGERY    . MOUTH SURGERY     wisdom teeth  . NASAL SEPTUM SURGERY     Social History   Socioeconomic History  . Marital status: Married    Spouse name: Not on file  . Number of children: 3  . Years of education: Not on file  . Highest education level: Not on file  Occupational History  . Occupation: Cabin crew  Social Needs  . Financial resource strain: Not on file  . Food insecurity:    Worry: Not on file    Inability: Not on file  . Transportation needs:    Medical: Not on file    Non-medical: Not on file  Tobacco Use  . Smoking status: Never Smoker  . Smokeless tobacco: Never Used  Substance and Sexual Activity  . Alcohol use: No    Alcohol/week: 0.0 standard drinks  . Drug use: No  . Sexual activity: Yes    Partners: Male    Birth control/protection: Other-see comments    Comment: husband with vasectomy  Lifestyle  . Physical activity:    Days per week: Not on file    Minutes per session: Not on file  . Stress: Not on file  Relationships  . Social connections:    Talks on phone: Not on  file    Gets together: Not on file    Attends religious service: Not on file    Active member of club or organization: Not on file    Attends meetings of clubs or organizations: Not on file    Relationship status: Not on file  Other Topics Concern  . Not on file  Social History Narrative  . Not on file   Allergies  Allergen Reactions  . Gluten Meal Other (See Comments)    Abdominal pain  . Lactose Intolerance (Gi)    Family History  Problem Relation Age of Onset  . Colon polyps Father   . Heart disease Father   . Stroke Father   . Diabetes Mother        diet controlled  . Stroke Maternal Grandfather   . Colon cancer Neg Hx     Current Outpatient Medications (Endocrine & Metabolic):  .  levothyroxine (SYNTHROID, LEVOTHROID) 50 MCG tablet, Take 1 tablet (50 mcg total) by mouth daily. Marland Kitchen  thyroid (ARMOUR THYROID) 60 MG tablet, Take 1 tablet (60 mg total) by mouth daily before breakfast.      Current Outpatient Medications (Other):  Marland Kitchen  Ascorbic Acid (VITAMIN C)  100 MG tablet, Take 100 mg by mouth daily. .  magnesium 30 MG tablet, Take 30 mg by mouth daily. .  Multiple Vitamin (MULTI-VITAMINS PO), Take by mouth daily. Marland Kitchen  OVER THE COUNTER MEDICATION, Vitamin D  10,000 every other day .  Probiotic Product (PROBIOTIC PO), Take by mouth daily.    Past medical history, social, surgical and family history all reviewed in electronic medical record.  No pertanent information unless stated regarding to the chief complaint.   Review of Systems:  No visual changes, nausea, vomiting, diarrhea, constipation, dizziness, abdominal pain, skin rash, fevers, chills, night sweats, weight loss, swollen lymph nodes, body aches, joint swelling, chest pain, shortness of breath, mood changes.  Positive muscle aches mild headaches  Objective  Blood pressure 100/60, pulse 73, height 5\' 3"  (1.6 m), weight 118 lb (53.5 kg), SpO2 98 %.    General: No apparent distress alert and oriented x3 mood  and affect normal, dressed appropriately.  HEENT: Pupils equal, extraocular movements intact  Respiratory: Patient's speak in full sentences and does not appear short of breath  Cardiovascular: No lower extremity edema, non tender, no erythema  Skin: Warm dry intact with no signs of infection or rash on extremities or on axial skeleton.  Abdomen: Soft nontender  Neuro: Cranial nerves II through XII are intact, neurovascularly intact in all extremities with 2+ DTRs and 2+ pulses.  Lymph: No lymphadenopathy of posterior or anterior cervical chain or axillae bilaterally.  Gait normal with good balance and coordination.  MSK:  Non tender with full range of motion and good stability and symmetric strength and tone of shoulders, elbows, wrist, hip, knee and ankles bilaterally.   Neck: Inspection mild loss of lordosis. No palpable stepoffs. Negative Spurling's maneuver. Mild limited in sidebending bilaterally by 5 to 10 degrees.  Mild crepitus noted as well Grip strength and sensation normal in bilateral hands Strength good C4 to T1 distribution No sensory change to C4 to T1 Negative Hoffman sign bilaterally Reflexes normal Tightness in the right trapezius  Osteopathic findings C2 flexed rotated and side bent right C4 flexed rotated and side bent left T4 extended rotated and side bent left L2 flexed rotated and side bent right Sacrum right on right     Impression and Recommendations:     This case required medical decision making of moderate complexity. The above documentation has been reviewed and is accurate and complete Lyndal Pulley, DO       Note: This dictation was prepared with Dragon dictation along with smaller phrase technology. Any transcriptional errors that result from this process are unintentional.

## 2018-01-15 NOTE — Assessment & Plan Note (Signed)
Moderate to severe.  Doing relatively well.  No radicular symptoms.  Not taking any neuromodulator.  Responding well known to patient's other treatments.  Follow-up again in 6-12 weeks

## 2018-01-27 ENCOUNTER — Telehealth: Payer: Self-pay | Admitting: Obstetrics and Gynecology

## 2018-01-27 DIAGNOSIS — R5383 Other fatigue: Secondary | ICD-10-CM | POA: Diagnosis not present

## 2018-01-27 DIAGNOSIS — E039 Hypothyroidism, unspecified: Secondary | ICD-10-CM | POA: Diagnosis not present

## 2018-01-27 DIAGNOSIS — N951 Menopausal and female climacteric states: Secondary | ICD-10-CM | POA: Diagnosis not present

## 2018-01-27 NOTE — Telephone Encounter (Signed)
Reviewed with A. Alexander and Dr. Talbert Nan. Call returned to patient, OV scheduled for pap on 02/04/18 at 1:15pm with Dr. Talbert Nan. Patient to check with insurance provider prior to OV to verify her benefits for pap. Is aware to return call with any additional questions.   Routing to provider for final review. Patient is agreeable to disposition. Will close encounter.  Cc: Thayer Ohm

## 2018-01-27 NOTE — Telephone Encounter (Signed)
Spoke with patient. Last pap 03/02/15 normal, neg hpv; last AEX 05/30/17, no pap. Patient will no longer have insurance after end of year, asking if ok to schedule OV for pap smear now with Dr. Talbert Nan?   Advised I will review with Dr. Talbert Nan and return call, patient agreeable.

## 2018-01-27 NOTE — Telephone Encounter (Signed)
Patient called with questions for the nurse about her last pap smear results and when she is next due for another pap smear specifically.

## 2018-01-27 NOTE — Telephone Encounter (Signed)
Erroneous encounter

## 2018-02-03 DIAGNOSIS — R5383 Other fatigue: Secondary | ICD-10-CM | POA: Diagnosis not present

## 2018-02-03 DIAGNOSIS — N959 Unspecified menopausal and perimenopausal disorder: Secondary | ICD-10-CM | POA: Diagnosis not present

## 2018-02-03 DIAGNOSIS — E039 Hypothyroidism, unspecified: Secondary | ICD-10-CM | POA: Diagnosis not present

## 2018-02-04 ENCOUNTER — Ambulatory Visit: Payer: Self-pay | Admitting: Obstetrics and Gynecology

## 2018-02-09 NOTE — Progress Notes (Signed)
GYNECOLOGY  VISIT   HPI: 57 y.o.   Married White or Caucasian Not Hispanic or Latino  female   [id number] with Patient's last menstrual period was 02/12/2018.   here for pap smear.  The patient isn't due for an annual until 1/20. Running out of insurance, wants to get her pap now. Husband is going to retire. 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  She had a cycle in 2-3/19, then she had what seemed like a cycle in September. She has had some random spotting in between.  Prior to the past year she was having cycles every 26 days.  She has had vasomotor symptoms on and off, not bad now. She had blood work at an [facility] in the last 2 weeks as was told she is still producing estrogen.  GYNECOLOGIC HISTORY: Patient's last menstrual period was 02/12/2018. Contraception: Spouse has a vasectomy Menopausal hormone therapy: None        OB History    Gravida  3   Para  3   Term  3   Preterm  0   AB  0   Living  3     SAB  0   TAB  0   Ectopic  0   Multiple  0   Live Births  3              Patient Active Problem List   Diagnosis Date Noted  . Hypothyroid   . Frozen shoulder 04/10/2017  . Migraine 11/01/2015  . DJD (degenerative joint disease), cervical 10/06/2015  . Nonallopathic lesion of cervical region 10/06/2015  . Nonallopathic lesion of thoracic region 10/06/2015  . Nonallopathic lesion of lumbosacral region 10/06/2015  . Unspecified constipation 02/10/2013  . Fibroids 02/10/2013  . Urinary incontinence 02/10/2013    Past Medical History:  Diagnosis Date  . Arthritis    of neck  . Fibroids, intramural 2014  . Frozen shoulder    right shoulder  . Hypothyroid   . Hypothyroidism   . Urinary incontinence    especially with coughing and sneezing    Past Surgical History:  Procedure Laterality Date  . BREAST ENHANCEMENT SURGERY    . FACIAL COSMETIC SURGERY    . MOUTH SURGERY     wisdom teeth  . NASAL SEPTUM SURGERY      Current Outpatient  Medications  Medication Sig Dispense Refill  . Ascorbic Acid (VITAMIN C) 100 MG tablet Take 100 mg by mouth daily.    Marland Kitchen levothyroxine (SYNTHROID, LEVOTHROID) 50 MCG tablet Take 1 tablet (50 mcg total) by mouth daily. 90 tablet 3  . magnesium 30 MG tablet Take 30 mg by mouth daily.    . Multiple Vitamin (MULTI-VITAMINS PO) Take by mouth daily.    Marland Kitchen OVER THE COUNTER MEDICATION Vitamin D  10,000 every other day    . Probiotic Product (PROBIOTIC PO) Take by mouth daily.    Marland Kitchen thyroid (ARMOUR THYROID) 60 MG tablet Take 1 tablet (60 mg total) by mouth daily before breakfast. (Patient taking differently: Take 90 mg by mouth daily before breakfast. ) 90 tablet 1   No current facility-administered medications for this visit.      ALLERGIES: Gluten meal and Lactose intolerance (gi)  Family History  Problem Relation Age of Onset  . Colon polyps Father   . Heart disease Father   . Stroke Father   . Diabetes Mother        diet controlled  . Stroke Maternal Grandfather   .  Colon cancer Neg Hx     Social History   Socioeconomic History  . Marital status: Married    Spouse name: Not on file  . Number of children: 3  . Years of education: Not on file  . Highest education level: Not on file  Occupational History  . Occupation: Cabin crew  Social Needs  . Financial resource strain: Not on file  . Food insecurity:    Worry: Not on file    Inability: Not on file  . Transportation needs:    Medical: Not on file    Non-medical: Not on file  Tobacco Use  . Smoking status: Never Smoker  . Smokeless tobacco: Never Used  Substance and Sexual Activity  . Alcohol use: No    Alcohol/week: 0.0 standard drinks  . Drug use: No  . Sexual activity: Yes    Partners: Male    Birth control/protection: Other-see comments    Comment: husband with vasectomy  Lifestyle  . Physical activity:    Days per week: Not on file    Minutes per session: Not on file  . Stress: Not on file  Relationships  .  Social connections:    Talks on phone: Not on file    Gets together: Not on file    Attends religious service: Not on file    Active member of club or organization: Not on file    Attends meetings of clubs or organizations: Not on file    Relationship status: Not on file  . Intimate partner violence:    Fear of current or ex partner: Not on file    Emotionally abused: Not on file    Physically abused: Not on file    Forced sexual activity: Not on file  Other Topics Concern  . Not on file  Social History Narrative  . Not on file    Review of Systems  Constitutional: Negative.   HENT: Negative.   Eyes: Negative.   Respiratory: Negative.   Cardiovascular: Negative.   Gastrointestinal: Negative.   Endocrine: Negative.   Genitourinary:       Spotting off and on  Musculoskeletal: Negative.   Skin: Negative.   Allergic/Immunologic: Negative.   Neurological: Negative.   Hematological: Negative.   Psychiatric/Behavioral: Negative.     PHYSICAL EXAMINATION:    BP 110/72 (BP Location: Right Arm, Patient Position: Sitting, Cuff Size: Normal)   Pulse 76   Ht 5\' 4"  (1.626 m)   Wt 115 lb 9.6 oz (52.4 kg)   LMP 02/12/2018   BMI 19.84 kg/m     General appearance: alert, cooperative and appears stated age Heart: regular rate and rhythm Lungs: CTAB Abdomen: soft, non-tender; bowel sounds normal; no masses,  no organomegaly Extremities: normal, atraumatic, no cyanosis Skin: normal color, texture and turgor, no rashes or lesions Lymph: normal cervical supraclavicular and inguinal nodes Neurologic: grossly normal    Pelvic: External genitalia:  no lesions              Urethra:  normal appearing urethra with no masses, tenderness or lesions              Bartholins and Skenes: normal                 Vagina: normal appearing vagina with normal color and discharge, no lesions              Cervix: no cervical motion tenderness, no lesions and small amount of blood coming from inside  her  cervix              Bimanual Exam:  Uterus:  normal size, contour, position, consistency, mobility, non-tender and anteverted              Adnexa: no mass, fullness, tenderness               Chaperone was present for exam.  ASSESSMENT AUB, perimenopausal Screening cervical cancer H/o hypothyroidism, last TSH was over suppressed     PLAN Ultrasound, possible sonohysterogram, possible endometrial biopsy (will do today) Pap with hpv Recent testing still with "estrogen production", will get a copy TFT's today (dose of synthroid recently decreased)  Addendum:   Ultrasound: few small myomas, thickened endometrium concerning for polyp. 2.8 cm simple left ovarian cyst Sonohysterogram The procedure and risks of the procedure were reviewed with the patient, consent form was signed. A speculum was placed in the vagina and the cervix was cleansed with betadine. A tenaculum was placed on the cervix. The sonohysterogram catheter was inserted into the uterine cavity without difficulty. The tenaculum and speculum were removed. Saline was infused under direct observation with the ultrasound. Two endometrial polyps were identified.The catheter was removed.   Assessment: AUB, endometrial polyp Simple left adnexal cyst, just under 3 cm  Plan: Plan: hysteroscopy, polypectomy, dilation and curettage. Reviewed risks, including: bleeding, infection, uterine perforation, fluid overload, need for further sugery Will get a copy of her recent lab work, if c/w being premenopausal will not f/u on the left ovarian cyst. If c/w postmenopausal will recommend f/u ultrasound     An After Visit Summary was printed and given to the patient.  ~25 minutes face to face time of which over 50% was spent in counseling.

## 2018-02-12 ENCOUNTER — Telehealth: Payer: Self-pay | Admitting: *Deleted

## 2018-02-12 ENCOUNTER — Ambulatory Visit (INDEPENDENT_AMBULATORY_CARE_PROVIDER_SITE_OTHER): Payer: BLUE CROSS/BLUE SHIELD | Admitting: Obstetrics and Gynecology

## 2018-02-12 ENCOUNTER — Other Ambulatory Visit (HOSPITAL_COMMUNITY)
Admission: RE | Admit: 2018-02-12 | Discharge: 2018-02-12 | Disposition: A | Discharge status: Home / Self Care | Payer: BLUE CROSS/BLUE SHIELD | Source: Ambulatory Visit | Attending: Obstetrics and Gynecology | Admitting: Obstetrics and Gynecology

## 2018-02-12 ENCOUNTER — Other Ambulatory Visit: Payer: Self-pay | Admitting: Obstetrics and Gynecology

## 2018-02-12 ENCOUNTER — Encounter: Payer: Self-pay | Admitting: Obstetrics and Gynecology

## 2018-02-12 ENCOUNTER — Ambulatory Visit (INDEPENDENT_AMBULATORY_CARE_PROVIDER_SITE_OTHER): Payer: BLUE CROSS/BLUE SHIELD

## 2018-02-12 VITALS — BP 110/72 | HR 76 | Ht 64.0 in | Wt 115.6 lb

## 2018-02-12 DIAGNOSIS — N939 Abnormal uterine and vaginal bleeding, unspecified: Secondary | ICD-10-CM

## 2018-02-12 DIAGNOSIS — N951 Menopausal and female climacteric states: Secondary | ICD-10-CM

## 2018-02-12 DIAGNOSIS — E039 Hypothyroidism, unspecified: Secondary | ICD-10-CM

## 2018-02-12 DIAGNOSIS — Z01419 Encounter for gynecological examination (general) (routine) without abnormal findings: Secondary | ICD-10-CM | POA: Diagnosis not present

## 2018-02-12 DIAGNOSIS — Z124 Encounter for screening for malignant neoplasm of cervix: Secondary | ICD-10-CM

## 2018-02-12 DIAGNOSIS — N840 Polyp of corpus uteri: Secondary | ICD-10-CM

## 2018-02-12 DIAGNOSIS — E079 Disorder of thyroid, unspecified: Secondary | ICD-10-CM

## 2018-02-12 NOTE — Telephone Encounter (Signed)
Call to patient. Discussed surgery scheduling and date options.  Patient desires to proceed on 02-23-18 as discussed with Dr Talbert Nan.  Surgery instruction sheet reviewed and printed copy will be mailed to patient with surgery center brochure.   Routing to Dr Talbert Nan. Encounter closed.

## 2018-02-13 ENCOUNTER — Telehealth: Payer: Self-pay | Admitting: Obstetrics and Gynecology

## 2018-02-13 LAB — THYROID PANEL WITH TSH
Free Thyroxine Index: 3.5 (ref 1.2–4.9)
T3 Uptake Ratio: 31 % (ref 24–39)
T4, Total: 11.2 ug/dL (ref 4.5–12.0)

## 2018-02-13 NOTE — Telephone Encounter (Signed)
-----   Message from Salvadore Dom, MD sent at 02/13/2018  2:51 PM EDT ----- I spoke with Dr Juleen China about the patient's over suppressed TSH and her upcoming surgery. She recommended pre-op EKG and offered to see the patient for a pre-op. Will set up an appointment for Monday.

## 2018-02-13 NOTE — Telephone Encounter (Signed)
Spoke with patient regarding benefit for scheduled surgery on 02/23/18. Patient understood and agreeable. Patient aware this is professional benefit only. Patient aware will be contacted by hospital for separate benefits.   Patient declined to confirm surgery at this time, stating "we" are waiting on lab results to confirm thyroid levels are ok. Patient adds she has started taking a low dose of thyroid medicine and request to redo labs next week if needed.   Routing to Lamont Snowball, RN

## 2018-02-13 NOTE — Telephone Encounter (Signed)
Results reviewed by Dr Talbert Nan. TSH remains suppressed. Recommends consult with Dr Juleen China, PCP for additional evaluation.  Call to patient. Advised of results and recommendations. Patient is agreeable.    Appointment scheduled with Dr Juleen China for Monday, 02-16-18 at 2:20 pm. Patient given directions and phone number.   Encounter closed.

## 2018-02-15 NOTE — Progress Notes (Signed)
Subjective:   Anna Young is a 57 y.o. female who presents to the office today for a preoperative consultation at the request of surgeon Dr. Sumner Boast, GYN, who plans on performing D&C/Hysteroscopy with [facility] on February 23, 2018. This consultation is requested for the specific conditions prompting preoperative evaluation (i.e. because of potential affect on operative risk): medication management, thyroid overtreatment. Planned anesthesia is general. The patient has the following known anesthesia issues: "sensitivity to medications." Patient has a bleeding risk of: vaginal spotting only. Patient does not have objections to receiving blood products if needed.  Hx of Hypothyroidism: Dx by Endocrinology in 2009. Maintained on Synthroid 88 mcg daily for years but felt terrible. Started seeing [facility], Dr. Sharol Roussel. Started Armour and increased up to 120 mcg daily. Dr. Sharol Roussel retired. She started seeing Marrion Coy at [place] [place] decreased to 90 based on labs. She was told that her "hormones were off" and Rx Pregnenolone. Now, with increased vaginal bleeding and migraines. She then went to Dr. Talbert Nan and decreased to 60 based on those labs. Feels good otherwise.  Review of Systems  Constitutional: Negative for chills, fever, malaise/fatigue and weight loss.  Respiratory: Negative for cough, shortness of breath and wheezing.   Cardiovascular: Negative for chest pain, palpitations and leg swelling.  Gastrointestinal: Negative for abdominal pain, constipation, diarrhea, nausea and vomiting.  Genitourinary: Negative for dysuria and urgency.  Musculoskeletal: Negative for joint pain and myalgias.  Skin: Negative for rash.  Neurological: Positive for headaches. Negative for dizziness.  Psychiatric/Behavioral: Negative for depression, substance abuse and suicidal ideas. The patient is not nervous/anxious.    Current Outpatient Medications:  .  magnesium  30 MG tablet, Take 30 mg by mouth daily., Disp: , Rfl:  .  Multiple Vitamin (MULTI-VITAMINS PO), Take by mouth daily., Disp: , Rfl:  .  Pregnenolone POWD, by Does not apply route., Disp: , Rfl:  .  Probiotic Product (PROBIOTIC PO), Take by mouth daily., Disp: , Rfl:  .  thyroid (ARMOUR THYROID) 60 MG tablet, Take 1 tablet (60 mg total) by mouth daily before breakfast.  .  TURMERIC PO, Take by mouth., Disp: , Rfl:  .  Vitamin D-Vitamin K (K2 PLUS D3 PO), Take by mouth., Disp: , Rfl:  .  ZINC-VITAMIN B12-VITAMIN C MT, Use as directed in the mouth or throat., Disp: , Rfl:   Patient Active Problem List   Diagnosis Date Noted  . Hypothyroid   . Frozen shoulder 04/10/2017  . Migraine 11/01/2015  . DJD (degenerative joint disease), cervical 10/06/2015  . Nonallopathic lesion of cervical region 10/06/2015  . Nonallopathic lesion of thoracic region 10/06/2015  . Nonallopathic lesion of lumbosacral region 10/06/2015  . Unspecified constipation 02/10/2013  . Fibroids 02/10/2013  . Urinary incontinence 02/10/2013   Past Surgical History:  Procedure Laterality Date  . BREAST ENHANCEMENT SURGERY    . FACIAL COSMETIC SURGERY    . NASAL SEPTUM SURGERY    . WISDOM TOOTH EXTRACTION     Social History   Socioeconomic History  . Marital status: Married    Spouse name: Not on file  . Number of children: 3  . Years of education: Not on file  . Highest education level: Not on file  Occupational History  . Occupation: Realtor  Tobacco Use  . Smoking status: Never Smoker  . Smokeless tobacco: Never Used  Substance and Sexual Activity  . Alcohol use: No    Alcohol/week: 0.0 standard drinks  . Drug  use: No  . Sexual activity: Yes    Partners: Male    Birth control/protection: Other-see comments    Comment: Husband - vasectomy  Lifestyle  . Physical activity:    Days per week: 0 days    Minutes per session: 0 min  . Stress: Not at all   The following portions of the patient's history were  reviewed and updated as appropriate: allergies, current medications, past family history, past medical history, past social history, past surgical history and problem list.    Objective:   Physical Exam BP 118/74   Pulse 77   Temp 98.6 F (37 C) (Oral)   Ht 5\' 4"  (1.626 m)   Wt 115 lb 12.8 oz (52.5 kg)   LMP 02/12/2018   SpO2 98%   BMI 19.88 kg/m   General Appearance:    Alert, cooperative, no distress, appears stated age  Head:    Normocephalic, without obvious abnormality, atraumatic  Eyes:    PERRL, conjunctiva/corneas clear, EOM's intact, fundi    benign, both eyes  Ears:    Normal TM's and external ear canals, both ears  Nose:   Nares normal, septum midline, mucosa normal, no drainage    or sinus tenderness  Throat:   Lips, mucosa, and tongue normal; teeth and gums normal  Neck:   Supple, symmetrical, trachea midline, no adenopathy;    thyroid:  no enlargement/tenderness/nodules; no carotid   bruit or JVD  Back:     Symmetric, no curvature, ROM normal, no CVA tenderness  Lungs:     Clear to auscultation bilaterally, respirations unlabored  Chest Wall:    No tenderness or deformity   Heart:    Regular rate and rhythm, S1 and S2 normal, no murmur, rub   or gallop  Breast Exam:    No tenderness, masses, or nipple abnormality  Abdomen:     Soft, non-tender, bowel sounds active all four quadrants,    no masses, no organomegaly  Extremities:   Extremities normal, atraumatic, no cyanosis or edema  Pulses:   2+ and symmetric all extremities  Skin:   Skin color, texture, turgor normal, no rashes or lesions  Lymph nodes:   Cervical, supraclavicular, and axillary nodes normal  Neurologic:   CNII-XII intact, normal strength, sensation and reflexes    throughout   Cardiographics ECG: no prior ECG, NSR without concerns  Lab Review   Results for orders placed or performed in visit on 02/12/18  Thyroid Panel With TSH  Result Value Ref Range   TSH <0.006 (L) 0.450 - 4.500 uIU/mL     T4, Total 11.2 4.5 - 12.0 ug/dL   T3 Uptake Ratio 31 24 - 39 %   Free Thyroxine Index 3.5 1.2 - 4.9  Cytology - PAP  Result Value Ref Range   Adequacy      Satisfactory for evaluation  endocervical/transformation zone component PRESENT.   Diagnosis      NEGATIVE FOR INTRAEPITHELIAL LESIONS OR MALIGNANCY. BENIGN REACTIVE/REPARATIVE CHANGES.   HPV NOT DETECTED    Material Submitted CervicoVaginal Pap [ThinPrep Imaged]    CYTOLOGY - PAP PAP RESULT    Lab Results  Component Value Date   WBC 7.4 05/28/2017   HGB 13.0 05/28/2017   HCT 38.2 05/28/2017   MCV 88 05/28/2017   PLT 309 05/28/2017   Lab Results  Component Value Date   CREATININE 0.54 (L) 05/28/2017    Assessment:   57 y.o. female with planned surgery as above.  Cardiac Risk Estimation: per the Revised Cardiac Risk Index, the patient's risk factors for cardiac complications include none, putting her in: RCI RISK CLASS I (0 risk factors, risk of major cardiac compl. appr. 0.5%)  Cerebrovascular Disease 1  Congestive Heart Failure 1  Creatinine > 2 1  Diabetes Requiring Insulin 1  Ischemic Cardiac Disease   Suprainguinal Vascular Surgery, Intrathoracic Surgery, Intra-abdominal Surgery (High Risk Surgery) 1    Plan:   1. Preoperative workup as follows ECG. 2. Change in medication regimen before surgery: continue (Armour 60) regimen including morning of surgery, with sip of water. Stop supplements. 3. Prophylaxis for cardiac events with perioperative beta-blockers: not indicated. 4. Invasive hemodynamic monitoring perioperatively: not indicated. 5. Deep vein thrombosis prophylaxis postoperatively:regimen to be chosen by surgical team. 6. Surveillance for postoperative MI with ECG immediately postoperatively and on postoperative days 1 and 2 AND troponin levels 24 hours postoperatively and on day 4 or hospital discharge (whichever comes first): not indicated. 7. Other measures: Postoperative incentive spirometry to  prevent pneumonia.   Briscoe Deutscher, DO  [place] [facility], [place] Phone: [phone] Fax: [phone]

## 2018-02-16 ENCOUNTER — Ambulatory Visit (INDEPENDENT_AMBULATORY_CARE_PROVIDER_SITE_OTHER): Payer: BLUE CROSS/BLUE SHIELD | Admitting: Family Medicine

## 2018-02-16 ENCOUNTER — Encounter: Payer: Self-pay | Admitting: Family Medicine

## 2018-02-16 VITALS — BP 118/74 | HR 77 | Temp 98.6°F | Ht 64.0 in | Wt 115.8 lb

## 2018-02-16 DIAGNOSIS — N939 Abnormal uterine and vaginal bleeding, unspecified: Secondary | ICD-10-CM

## 2018-02-16 DIAGNOSIS — Z01818 Encounter for other preprocedural examination: Secondary | ICD-10-CM | POA: Diagnosis not present

## 2018-02-16 DIAGNOSIS — E039 Hypothyroidism, unspecified: Secondary | ICD-10-CM | POA: Diagnosis not present

## 2018-02-16 LAB — CYTOLOGY - PAP
DIAGNOSIS: NEGATIVE
HPV: NOT DETECTED

## 2018-02-17 NOTE — H&P (Signed)
GYNECOLOGY  VISIT   HPI: 57 y.o.   Married White or Caucasian Not Hispanic or Latino  female   [phone] with Patient's last menstrual period was 02/12/2018.   here for pap smear.  The patient isn't due for an annual until 1/20. Running out of insurance, wants to get her pap now. Husband is going to retire. 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  She had a cycle in 2-3/19, then she had what seemed like a cycle in September. She has had some random spotting in between.  Prior to the past year she was having cycles every 26 days.  She has had vasomotor symptoms on and off, not bad now. She had blood work at an [facility] in the last 2 weeks as was told she is still producing estrogen.  GYNECOLOGIC HISTORY: Patient's last menstrual period was 02/12/2018. Contraception: Spouse has a vasectomy Menopausal hormone therapy: None                OB History    Gravida  3   Para  3   Term  3   Preterm  0   AB  0   Living  3     SAB  0   TAB  0   Ectopic  0   Multiple  0   Live Births  3                  Patient Active Problem List   Diagnosis Date Noted  . Hypothyroid   . Frozen shoulder 04/10/2017  . Migraine 11/01/2015  . DJD (degenerative joint disease), cervical 10/06/2015  . Nonallopathic lesion of cervical region 10/06/2015  . Nonallopathic lesion of thoracic region 10/06/2015  . Nonallopathic lesion of lumbosacral region 10/06/2015  . Unspecified constipation 02/10/2013  . Fibroids 02/10/2013  . Urinary incontinence 02/10/2013        Past Medical History:  Diagnosis Date  . Arthritis    of neck  . Fibroids, intramural 2014  . Frozen shoulder    right shoulder  . Hypothyroid   . Hypothyroidism   . Urinary incontinence    especially with coughing and sneezing         Past Surgical History:  Procedure Laterality Date  . BREAST ENHANCEMENT SURGERY    . FACIAL COSMETIC SURGERY    . MOUTH SURGERY     wisdom  teeth  . NASAL SEPTUM SURGERY            Current Outpatient Medications  Medication Sig Dispense Refill  . Ascorbic Acid (VITAMIN C) 100 MG tablet Take 100 mg by mouth daily.    Marland Kitchen levothyroxine (SYNTHROID, LEVOTHROID) 50 MCG tablet Take 1 tablet (50 mcg total) by mouth daily. 90 tablet 3  . magnesium 30 MG tablet Take 30 mg by mouth daily.    . Multiple Vitamin (MULTI-VITAMINS PO) Take by mouth daily.    Marland Kitchen OVER THE COUNTER MEDICATION Vitamin D  10,000 every other day    . Probiotic Product (PROBIOTIC PO) Take by mouth daily.    Marland Kitchen thyroid (ARMOUR THYROID) 60 MG tablet Take 1 tablet (60 mg total) by mouth daily before breakfast. (Patient taking differently: Take 90 mg by mouth daily before breakfast. ) 90 tablet 1   No current facility-administered medications for this visit.      ALLERGIES: Gluten meal and Lactose intolerance (gi)       Family History  Problem Relation Age of Onset  . Colon  polyps Father   . Heart disease Father   . Stroke Father   . Diabetes Mother        diet controlled  . Stroke Maternal Grandfather   . Colon cancer Neg Hx     Social History        Socioeconomic History  . Marital status: Married    Spouse name: Not on file  . Number of children: 3  . Years of education: Not on file  . Highest education level: Not on file  Occupational History  . Occupation: Cabin crew  Social Needs  . Financial resource strain: Not on file  . Food insecurity:    Worry: Not on file    Inability: Not on file  . Transportation needs:    Medical: Not on file    Non-medical: Not on file  Tobacco Use  . Smoking status: Never Smoker  . Smokeless tobacco: Never Used  Substance and Sexual Activity  . Alcohol use: No    Alcohol/week: 0.0 standard drinks  . Drug use: No  . Sexual activity: Yes    Partners: Male    Birth control/protection: Other-see comments    Comment: husband with vasectomy  Lifestyle  . Physical  activity:    Days per week: Not on file    Minutes per session: Not on file  . Stress: Not on file  Relationships  . Social connections:    Talks on phone: Not on file    Gets together: Not on file    Attends religious service: Not on file    Active member of club or organization: Not on file    Attends meetings of clubs or organizations: Not on file    Relationship status: Not on file  . Intimate partner violence:    Fear of current or ex partner: Not on file    Emotionally abused: Not on file    Physically abused: Not on file    Forced sexual activity: Not on file  Other Topics Concern  . Not on file  Social History Narrative  . Not on file    Review of Systems  Constitutional: Negative.   HENT: Negative.   Eyes: Negative.   Respiratory: Negative.   Cardiovascular: Negative.   Gastrointestinal: Negative.   Endocrine: Negative.   Genitourinary:       Spotting off and on  Musculoskeletal: Negative.   Skin: Negative.   Allergic/Immunologic: Negative.   Neurological: Negative.   Hematological: Negative.   Psychiatric/Behavioral: Negative.     PHYSICAL EXAMINATION:    BP 110/72 (BP Location: Right Arm, Patient Position: Sitting, Cuff Size: Normal)   Pulse 76   Ht 5\' 4"  (1.626 m)   Wt 115 lb 9.6 oz (52.4 kg)   LMP 02/12/2018   BMI 19.84 kg/m     General appearance: alert, cooperative and appears stated age Heart: regular rate and rhythm Lungs: CTAB Abdomen: soft, non-tender; bowel sounds normal; no masses,  no organomegaly Extremities: normal, atraumatic, no cyanosis Skin: normal color, texture and turgor, no rashes or lesions Lymph: normal cervical supraclavicular and inguinal nodes Neurologic: grossly normal    Pelvic: External genitalia:  no lesions              Urethra:  normal appearing urethra with no masses, tenderness or lesions              Bartholins and Skenes: normal                 Vagina:  normal appearing vagina  with normal color and discharge, no lesions              Cervix: no cervical motion tenderness, no lesions and small amount of blood coming from inside her cervix              Bimanual Exam:  Uterus:  normal size, contour, position, consistency, mobility, non-tender and anteverted              Adnexa: no mass, fullness, tenderness               Chaperone was present for exam.  ASSESSMENT AUB, perimenopausal Screening cervical cancer H/o hypothyroidism, last TSH was over suppressed     PLAN Ultrasound, possible sonohysterogram, possible endometrial biopsy (will do today) Pap with hpv Recent testing still with "estrogen production", will get a copy TFT's today (dose of synthroid recently decreased)  Addendum:   Ultrasound: few small myomas, thickened endometrium concerning for polyp. 2.8 cm simple left ovarian cyst Sonohysterogram The procedure and risks of the procedure were reviewed with the patient, consent form was signed. A speculum was placed in the vagina and the cervix was cleansed with betadine. A tenaculum was placed on the cervix. The sonohysterogram catheter was inserted into the uterine cavity without difficulty. The tenaculum and speculum were removed. Saline was infused under direct observation with the ultrasound. Two endometrial polyps were identified.The catheter was removed.   Assessment: AUB, endometrial polyp Simple left adnexal cyst, just under 3 cm  Plan: Plan: hysteroscopy, polypectomy, dilation and curettage. Reviewed risks, including: bleeding, infection, uterine perforation, fluid overload, need for further sugery Will get a copy of her recent lab work, if c/w being premenopausal will not f/u on the left ovarian cyst. If c/w postmenopausal will recommend f/u ultrasound     An After Visit Summary was printed and given to the patient.

## 2018-02-18 ENCOUNTER — Other Ambulatory Visit: Payer: Self-pay

## 2018-02-18 ENCOUNTER — Encounter (HOSPITAL_BASED_OUTPATIENT_CLINIC_OR_DEPARTMENT_OTHER): Payer: Self-pay

## 2018-02-18 NOTE — Progress Notes (Signed)
Spoke with:  Seth Bake NPO:  After Midnight, no gum, candy, or mints   Arrival time: 0745AM Labs: Istat 4 (EKG 02/16/2018 chart/epic) AM medications: Thyroid Pre op orders: Yes Ride home:  Zenia Resides (husband) [phone]

## 2018-02-23 ENCOUNTER — Ambulatory Visit (HOSPITAL_BASED_OUTPATIENT_CLINIC_OR_DEPARTMENT_OTHER)
Admission: RE | Admit: 2018-02-23 | Discharge: 2018-02-23 | Disposition: A | Discharge status: Home / Self Care | Payer: BLUE CROSS/BLUE SHIELD | Source: Ambulatory Visit | Attending: Obstetrics and Gynecology | Admitting: Obstetrics and Gynecology

## 2018-02-23 ENCOUNTER — Encounter (HOSPITAL_BASED_OUTPATIENT_CLINIC_OR_DEPARTMENT_OTHER): Payer: Self-pay | Admitting: *Deleted

## 2018-02-23 ENCOUNTER — Ambulatory Visit (HOSPITAL_BASED_OUTPATIENT_CLINIC_OR_DEPARTMENT_OTHER): Payer: BLUE CROSS/BLUE SHIELD | Admitting: Anesthesiology

## 2018-02-23 ENCOUNTER — Other Ambulatory Visit: Payer: Self-pay

## 2018-02-23 ENCOUNTER — Encounter (HOSPITAL_BASED_OUTPATIENT_CLINIC_OR_DEPARTMENT_OTHER)
Admission: RE | Disposition: A | Discharge status: Home / Self Care | Payer: Self-pay | Source: Ambulatory Visit | Attending: Obstetrics and Gynecology

## 2018-02-23 DIAGNOSIS — N840 Polyp of corpus uteri: Secondary | ICD-10-CM | POA: Insufficient documentation

## 2018-02-23 DIAGNOSIS — M47812 Spondylosis without myelopathy or radiculopathy, cervical region: Secondary | ICD-10-CM | POA: Insufficient documentation

## 2018-02-23 DIAGNOSIS — E039 Hypothyroidism, unspecified: Secondary | ICD-10-CM | POA: Diagnosis not present

## 2018-02-23 DIAGNOSIS — N83292 Other ovarian cyst, left side: Secondary | ICD-10-CM | POA: Insufficient documentation

## 2018-02-23 DIAGNOSIS — N924 Excessive bleeding in the premenopausal period: Secondary | ICD-10-CM | POA: Insufficient documentation

## 2018-02-23 DIAGNOSIS — E739 Lactose intolerance, unspecified: Secondary | ICD-10-CM | POA: Insufficient documentation

## 2018-02-23 DIAGNOSIS — Z79899 Other long term (current) drug therapy: Secondary | ICD-10-CM | POA: Insufficient documentation

## 2018-02-23 DIAGNOSIS — N951 Menopausal and female climacteric states: Secondary | ICD-10-CM | POA: Diagnosis not present

## 2018-02-23 DIAGNOSIS — N939 Abnormal uterine and vaginal bleeding, unspecified: Secondary | ICD-10-CM | POA: Diagnosis not present

## 2018-02-23 HISTORY — PX: DILATATION & CURETTAGE/HYSTEROSCOPY WITH MYOSURE: SHX6511

## 2018-02-23 HISTORY — DX: Other specified postprocedural states: Z98.890

## 2018-02-23 HISTORY — DX: Presence of spectacles and contact lenses: Z97.3

## 2018-02-23 HISTORY — DX: Nausea with vomiting, unspecified: R11.2

## 2018-02-23 HISTORY — DX: Migraine, unspecified, not intractable, without status migrainosus: G43.909

## 2018-02-23 HISTORY — DX: Polyp of corpus uteri: N84.0

## 2018-02-23 HISTORY — DX: Abnormal uterine and vaginal bleeding, unspecified: N93.9

## 2018-02-23 LAB — POCT I-STAT 4, (NA,K, GLUC, HGB,HCT)
Glucose, Bld: 98 mg/dL (ref 70–99)
HCT: 39 % (ref 36.0–46.0)
HEMOGLOBIN: 13.3 g/dL (ref 12.0–15.0)
Potassium: 3.9 mmol/L (ref 3.5–5.1)
SODIUM: 141 mmol/L (ref 135–145)

## 2018-02-23 SURGERY — DILATATION & CURETTAGE/HYSTEROSCOPY WITH MYOSURE
Anesthesia: General | Site: Vagina

## 2018-02-23 MED ORDER — PROPOFOL 10 MG/ML IV BOLUS
INTRAVENOUS | Status: DC | PRN
  Administered 2018-02-23: 140 mg via INTRAVENOUS

## 2018-02-23 MED ORDER — SUCCINYLCHOLINE CHLORIDE 200 MG/10ML IV SOSY
PREFILLED_SYRINGE | INTRAVENOUS | Status: AC
  Filled 2018-02-23: qty 20

## 2018-02-23 MED ORDER — FENTANYL CITRATE (PF) 100 MCG/2ML IJ SOLN
INTRAMUSCULAR | Status: AC
  Filled 2018-02-23: qty 2

## 2018-02-23 MED ORDER — DEXAMETHASONE SODIUM PHOSPHATE 10 MG/ML IJ SOLN
INTRAMUSCULAR | Status: DC | PRN
  Administered 2018-02-23: 10 mg via INTRAVENOUS

## 2018-02-23 MED ORDER — MIDAZOLAM HCL 2 MG/2ML IJ SOLN
INTRAMUSCULAR | Status: AC
  Filled 2018-02-23: qty 2

## 2018-02-23 MED ORDER — OXYCODONE HCL 5 MG/5ML PO SOLN
5.0000 mg | Freq: Once | ORAL | Status: DC | PRN
  Filled 2018-02-23: qty 5

## 2018-02-23 MED ORDER — OXYCODONE HCL 5 MG PO TABS
5.0000 mg | ORAL_TABLET | Freq: Once | ORAL | Status: DC | PRN
  Filled 2018-02-23: qty 1

## 2018-02-23 MED ORDER — DEXAMETHASONE SODIUM PHOSPHATE 10 MG/ML IJ SOLN
INTRAMUSCULAR | Status: AC
  Filled 2018-02-23: qty 2

## 2018-02-23 MED ORDER — ONDANSETRON HCL 4 MG/2ML IJ SOLN
INTRAMUSCULAR | Status: AC
  Filled 2018-02-23: qty 4

## 2018-02-23 MED ORDER — MIDAZOLAM HCL 2 MG/2ML IJ SOLN
INTRAMUSCULAR | Status: DC | PRN
  Administered 2018-02-23: 0.5 mg via INTRAVENOUS

## 2018-02-23 MED ORDER — LIDOCAINE 2% (20 MG/ML) 5 ML SYRINGE
INTRAMUSCULAR | Status: DC | PRN
  Administered 2018-02-23: 60 mg via INTRAVENOUS

## 2018-02-23 MED ORDER — LIDOCAINE 2% (20 MG/ML) 5 ML SYRINGE
INTRAMUSCULAR | Status: AC
  Filled 2018-02-23: qty 10

## 2018-02-23 MED ORDER — PROPOFOL 10 MG/ML IV BOLUS
INTRAVENOUS | Status: AC
  Filled 2018-02-23: qty 20

## 2018-02-23 MED ORDER — HYDROMORPHONE HCL 1 MG/ML IJ SOLN
0.2500 mg | INTRAMUSCULAR | Status: DC | PRN
  Filled 2018-02-23: qty 0.5

## 2018-02-23 MED ORDER — EPHEDRINE 5 MG/ML INJ
INTRAVENOUS | Status: AC
  Filled 2018-02-23: qty 20

## 2018-02-23 MED ORDER — PROMETHAZINE HCL 25 MG/ML IJ SOLN
6.2500 mg | INTRAMUSCULAR | Status: DC | PRN
  Filled 2018-02-23: qty 1

## 2018-02-23 MED ORDER — ROCURONIUM BROMIDE 100 MG/10ML IV SOLN
INTRAVENOUS | Status: AC
  Filled 2018-02-23: qty 2

## 2018-02-23 MED ORDER — KETOROLAC TROMETHAMINE 30 MG/ML IJ SOLN
INTRAMUSCULAR | Status: DC | PRN
  Administered 2018-02-23: 30 mg via INTRAVENOUS

## 2018-02-23 MED ORDER — LACTATED RINGERS IV SOLN
INTRAVENOUS | Status: DC
  Administered 2018-02-23 (×2): via INTRAVENOUS
  Filled 2018-02-23: qty 1000

## 2018-02-23 MED ORDER — FENTANYL CITRATE (PF) 100 MCG/2ML IJ SOLN
INTRAMUSCULAR | Status: DC | PRN
  Administered 2018-02-23: 50 ug via INTRAVENOUS

## 2018-02-23 MED ORDER — SUGAMMADEX SODIUM 200 MG/2ML IV SOLN
INTRAVENOUS | Status: AC
  Filled 2018-02-23: qty 2

## 2018-02-23 SURGICAL SUPPLY — 20 items
CANISTER SUCT 3000ML PPV (MISCELLANEOUS) ×4 IMPLANT
CATH ROBINSON RED A/P 16FR (CATHETERS) IMPLANT
DEVICE MYOSURE LITE (MISCELLANEOUS) IMPLANT
DEVICE MYOSURE REACH (MISCELLANEOUS) ×2 IMPLANT
DILATOR CANAL MILEX (MISCELLANEOUS) IMPLANT
GAUZE 4X4 16PLY RFD (DISPOSABLE) ×2 IMPLANT
GLOVE BIO SURGEON STRL SZ 6.5 (GLOVE) ×2 IMPLANT
GOWN STRL REUS W/TWL LRG LVL3 (GOWN DISPOSABLE) ×2 IMPLANT
IV NS IRRIG 3000ML ARTHROMATIC (IV SOLUTION) ×2 IMPLANT
KIT PROCEDURE FLUENT (KITS) ×2 IMPLANT
KIT TURNOVER CYSTO (KITS) ×2 IMPLANT
MYOSURE XL FIBROID (MISCELLANEOUS)
PACK VAGINAL MINOR WOMEN LF (CUSTOM PROCEDURE TRAY) ×2 IMPLANT
PAD OB MATERNITY 4.3X12.25 (PERSONAL CARE ITEMS) ×2 IMPLANT
PAD PREP 24X48 CUFFED NSTRL (MISCELLANEOUS) ×2 IMPLANT
SEAL ROD LENS SCOPE MYOSURE (ABLATOR) ×2 IMPLANT
SYR 20CC LL (SYRINGE) IMPLANT
SYSTEM TISS REMOVAL MYOSURE XL (MISCELLANEOUS) IMPLANT
TOWEL OR 17X24 6PK STRL BLUE (TOWEL DISPOSABLE) ×4 IMPLANT
WATER STERILE IRR 500ML POUR (IV SOLUTION) IMPLANT

## 2018-02-23 NOTE — Op Note (Signed)
Preoperative Diagnosis: Abnormal uterine bleeding, perimenopausal  Postoperative Diagnosis: same  Procedure: Hysteroscopy, polypectomy, dilation and curettage  Surgeon: Dr Sumner Boast  Assistants: None  Anesthesia: General via LMA  EBL: 5 cc  Fluids: 800 cc LR  Fluid deficit: 145 cc  Urine output: not recorded  Indications for surgery: The patient is a 57 yo female, who presented with abnormal uterine bleeding. Work up included a sonohysterogram that showed endometrial polyps.  The risks of the surgery were reviewed with the patient and the consent form was signed prior to her surgery.  Findings: EUA: normal sized uterus, no adnexal masses. Hysteroscopy: endometrial polyp, 2nd possible polyp. Normal tubal ostia bilaterally, otherwise thin appearing endometrium  Specimens: Endometrial polyp, endometrial curettings   Procedure: The patient was taken to the operating room with an IV in place. She was placed in the dorsal lithotomy position and anesthesia was administered. She was prepped and draped in the usual sterile fashion for a vaginal procedure. She voided on the way to the OR. A weighted speculum was placed in the vagina and a single tooth tenaculum was placed on the anterior lip of the cervix. The cervix was dilated to a #7 hagar dilator. The uterus was sounded to 8 cm. The myosure hysteroscope was inserted into the uterine cavity. With continuous infusion of normal saline, the uterine cavity was visualized with the above findings. The myosure reach was used to resect the endometrial polyps. The myosure was then removed. The cavity was then curetted with the small sharp curette. The cavity had the characteristically gritty texture at the end of the procedure. The curette and the single tooth tenaculum were removed. Oozing from the tenaculum site was stopped with pressure. The speculum was removed. The patients perineum was cleansed of betadine and she was taken out of the dorsal  lithotomy position.  Upon awakening the LMA was removed and the patient was transferred to the recovery room in stable and awake condition.  The sponge and instrument count were correct. There were no complications.   CC: Dr Juleen China

## 2018-02-23 NOTE — Anesthesia Postprocedure Evaluation (Signed)
Anesthesia Post Note  Patient: Anna Young  Procedure(s) Performed: DILATATION & CURETTAGE/HYSTEROSCOPY WITH MYOSURE (N/A Vagina )     Patient location during evaluation: PACU Anesthesia Type: General Level of consciousness: awake and alert Pain management: pain level controlled Vital Signs Assessment: post-procedure vital signs reviewed and stable Respiratory status: spontaneous breathing, nonlabored ventilation, respiratory function stable and patient connected to nasal cannula oxygen Cardiovascular status: blood pressure returned to baseline and stable Postop Assessment: no apparent nausea or vomiting Anesthetic complications: no    Last Vitals:  Vitals:   02/23/18 1200 02/23/18 1238  BP:  109/63  Pulse: 70 70  Resp: 14 12  Temp:  36.6 C  SpO2: 99% 97%    Last Pain:  Vitals:   02/23/18 1238  TempSrc:   PainSc: 0-No pain                 Alexee Delsanto P Tameeka Luo

## 2018-02-23 NOTE — Anesthesia Procedure Notes (Signed)
Date/Time: 02/23/2018 10:56 AM Performed by: Cynda Familia, CRNA Oxygen Delivery Method: Nasal cannula Placement Confirmation: positive ETCO2 and breath sounds checked- equal and bilateral Dental Injury: Teeth and Oropharynx as per pre-operative assessment

## 2018-02-23 NOTE — Anesthesia Procedure Notes (Signed)
Procedure Name: LMA Insertion Date/Time: 02/23/2018 10:28 AM Performed by: Cynda Familia, CRNA Pre-anesthesia Checklist: Patient identified, Emergency Drugs available, Suction available and Patient being monitored Patient Re-evaluated:Patient Re-evaluated prior to induction Oxygen Delivery Method: Circle System Utilized Preoxygenation: Pre-oxygenation with 100% oxygen Induction Type: IV induction Ventilation: Mask ventilation without difficulty LMA: LMA inserted LMA Size: 4.0 Number of attempts: 1 Placement Confirmation: positive ETCO2 Tube secured with: Tape Dental Injury: Teeth and Oropharynx as per pre-operative assessment  Comments: Smooth IV induction [name]-- LMA insertion AM CRNA atraumatic -- teeth and mouth as preop-- BS =

## 2018-02-23 NOTE — Anesthesia Preprocedure Evaluation (Addendum)
Anesthesia Evaluation  Patient identified by MRN, date of birth, ID band Patient awake    Reviewed: Allergy & Precautions, NPO status , Patient's Chart, lab work & pertinent test results  History of Anesthesia Complications (+) PONV and history of anesthetic complications  Airway Mallampati: I  TM Distance: >3 FB Neck ROM: Full    Dental no notable dental hx. (+) Dental Advisory Given   Pulmonary neg pulmonary ROS,    Pulmonary exam normal breath sounds clear to auscultation       Cardiovascular negative cardio ROS Normal cardiovascular exam Rhythm:Regular Rate:Normal  ECG: SR, rate 69   Neuro/Psych  Headaches, negative psych ROS   GI/Hepatic negative GI ROS, Neg liver ROS,   Endo/Other  Hypothyroidism   Renal/GU negative Renal ROS     Musculoskeletal negative musculoskeletal ROS (+)   Abdominal   Peds  Hematology negative hematology ROS (+)   Anesthesia Other Findings AUB Endometrial polyp  Reproductive/Obstetrics                           Anesthesia Physical Anesthesia Plan  ASA: II  Anesthesia Plan: General   Post-op Pain Management:    Induction: Intravenous  PONV Risk Score and Plan: 4 or greater and Midazolam, Dexamethasone, Ondansetron and Treatment may vary due to age or medical condition  Airway Management Planned: LMA  Additional Equipment:   Intra-op Plan:   Post-operative Plan: Extubation in OR  Informed Consent: I have reviewed the patients History and Physical, chart, labs and discussed the procedure including the risks, benefits and alternatives for the proposed anesthesia with the patient or authorized representative who has indicated his/her understanding and acceptance.   Dental advisory given  Plan Discussed with: CRNA  Anesthesia Plan Comments:        Anesthesia Quick Evaluation

## 2018-02-23 NOTE — Interval H&P Note (Signed)
History and Physical Interval Note:  02/23/2018 10:15 AM  Anna Young  has presented today for surgery, with the diagnosis of AUB, endometrial polyp  The various methods of treatment have been discussed with the patient and family. After consideration of risks, benefits and other options for treatment, the patient has consented to  Procedure(s): Wilder (N/A) as a surgical intervention .  The patient's history has been reviewed, patient examined, no change in status, stable for surgery.  I have reviewed the patient's chart and labs.  Questions were answered to the patient's satisfaction.     Salvadore Dom

## 2018-02-23 NOTE — Transfer of Care (Signed)
Immediate Anesthesia Transfer of Care Note  Patient: Anna Young  Procedure(s) Performed: DILATATION & CURETTAGE/HYSTEROSCOPY WITH MYOSURE (N/A Vagina )  Patient Location: PACU  Anesthesia Type:General  Level of Consciousness: awake and alert   Airway & Oxygen Therapy: Patient Spontanous Breathing and Patient connected to nasal cannula oxygen  Post-op Assessment: Report given to RN and Post -op Vital signs reviewed and stable  Post vital signs: Reviewed and stable  Last Vitals:  Vitals Value Taken Time  BP 110/71 02/23/2018 11:02 AM  Temp    Pulse 71 02/23/2018 11:03 AM  Resp 10 02/23/2018 11:03 AM  SpO2 100 % 02/23/2018 11:03 AM  Vitals shown include unvalidated device data.  Last Pain:  Vitals:   02/23/18 0822  TempSrc:   PainSc: 1       Patients Stated Pain Goal: 8 (40/98/11 9147)  Complications: No apparent anesthesia complications

## 2018-02-23 NOTE — Discharge Instructions (Signed)
° °   NO ADVIL, ALEVE, MOTRIN, IBUPROFEN UNTIL 5 PM TODAY   Post Anesthesia Home Care Instructions  Activity: Get plenty of rest for the remainder of the day. A responsible adult should stay with you for 24 hours following the procedure.  For the next 24 hours, DO NOT: -Drive a car -Paediatric nurse -Drink alcoholic beverages -Take any medication unless instructed by your physician -Make any legal decisions or sign important papers.  Meals: Start with liquid foods such as gelatin or soup. Progress to regular foods as tolerated. Avoid greasy, spicy, heavy foods. If nausea and/or vomiting occur, drink only clear liquids until the nausea and/or vomiting subsides. Call your physician if vomiting continues.  Special Instructions/Symptoms: Your throat may feel dry or sore from the anesthesia or the breathing tube placed in your throat during surgery. If this causes discomfort, gargle with warm salt water. The discomfort should disappear within 24 hours.  If you had a scopolamine patch placed behind your ear for the management of post- operative nausea and/or vomiting:  1. The medication in the patch is effective for 72 hours, after which it should be removed.  Wrap patch in a tissue and discard in the trash. Wash hands thoroughly with soap and water. 2. You may remove the patch earlier than 72 hours if you experience unpleasant side effects which may include dry mouth, dizziness or visual disturbances. 3. Avoid touching the patch. Wash your hands with soap and water after contact with the patch.

## 2018-02-24 ENCOUNTER — Encounter (HOSPITAL_BASED_OUTPATIENT_CLINIC_OR_DEPARTMENT_OTHER): Payer: Self-pay | Admitting: Obstetrics and Gynecology

## 2018-03-03 ENCOUNTER — Ambulatory Visit: Payer: BLUE CROSS/BLUE SHIELD | Admitting: Family Medicine

## 2018-03-03 NOTE — Progress Notes (Signed)
[facility] [place], [place] [address] Phone: [phone] Subjective:   [name], am serving as a scribe for Dr. Hulan Saas.  CC: Neck pain follow-up  [id number]  Anna Young is a 57 y.o. female coming in with complaint of neck pain. She is here for OMT today.  Patient has responded very well.  Degenerative disc disease of the cervical spine.  We discussed icing regimen and home exercise patient states some increasing stress recently..        Past Medical History:  Diagnosis Date  . Abnormal uterine bleeding (AUB)   . Cervical osteoarthritis    Neck  . Endometrial polyp   . Fibroids, intramural 2014  . Frozen shoulder, right   . Hypothyroidism    Originally Dx by Endocrinology in 2009  . Migraines   . PONV (postoperative nausea and vomiting)   . Urinary incontinence, stress   . Wears glasses    Reading   Past Surgical History:  Procedure Laterality Date  . BREAST ENHANCEMENT SURGERY    . COLONOSCOPY  04/2017  . DILATATION & CURETTAGE/HYSTEROSCOPY WITH MYOSURE N/A 02/23/2018   Procedure: DILATATION & CURETTAGE/HYSTEROSCOPY WITH MYOSURE;  Surgeon: Salvadore Dom, MD;  Location: [facility];  Service: Gynecology;  Laterality: N/A;  . FACIAL COSMETIC SURGERY    . NASAL SEPTUM SURGERY    . WISDOM TOOTH EXTRACTION     Social History   Socioeconomic History  . Marital status: Married    Spouse name: Not on file  . Number of children: 3  . Years of education: Not on file  . Highest education level: Not on file  Occupational History  . Occupation: Cabin crew  Social Needs  . Financial resource strain: Not on file  . Food insecurity:    Worry: Not on file    Inability: Not on file  . Transportation needs:    Medical: Not on file    Non-medical: Not on file  Tobacco Use  . Smoking status: Never Smoker  . Smokeless tobacco: Never Used  Substance and Sexual Activity  . Alcohol use: No      Alcohol/week: 0.0 standard drinks  . Drug use: No  . Sexual activity: Yes    Partners: Male    Birth control/protection: Other-see comments    Comment: Husband - vasectomy  Lifestyle  . Physical activity:    Days per week: 0 days    Minutes per session: 0 min  . Stress: Not at all  Relationships  . Social connections:    Talks on phone: Not on file    Gets together: Not on file    Attends religious service: Not on file    Active member of club or organization: Not on file    Attends meetings of clubs or organizations: Not on file    Relationship status: Not on file  Other Topics Concern  . Not on file  Social History Narrative  . Not on file   Allergies  Allergen Reactions  . Gluten Meal Other (See Comments)    Abdominal pain  . Lactose Intolerance (Gi)    Family History  Problem Relation Age of Onset  . Colon polyps Father   . Heart disease Father 66  . Stroke Father   . Diabetes Mother   . Stroke Maternal Grandfather   . Colon cancer Neg Hx     Current Outpatient Medications (Endocrine & Metabolic):  .  testosterone (ANDROGEL)  50 MG/5GM (1%) GEL, Place 5 g onto the skin daily. Marland Kitchen  thyroid (ARMOUR THYROID) 60 MG tablet, Take 1 tablet (60 mg total) by mouth daily before breakfast.     Current Outpatient Medications (Hematological):  Marland Kitchen  Cyanocobalamin (VITAMIN B 12 PO), Take by mouth.  Current Outpatient Medications (Other):  .  magnesium 30 MG tablet, Take 30 mg by mouth daily. .  Multiple Vitamin (MULTI-VITAMINS PO), Take by mouth daily. .  Pregnenolone POWD, by Does not apply route. .  Probiotic Product (PROBIOTIC PO), Take by mouth daily. .  TURMERIC PO, Take by mouth. .  Vitamin D-Vitamin K (K2 PLUS D3 PO), Take by mouth. .  Zinc Sulfate (ZINC 15 PO), Take by mouth.    Past medical history, social, surgical and family history all reviewed in electronic medical record.  No pertanent information unless stated regarding to the chief complaint.    Review of Systems:  No , visual changes, nausea, vomiting, diarrhea, constipation, dizziness, abdominal pain, skin rash, fevers, chills, night sweats, weight loss, swollen lymph nodes, body aches, joint swelling, muscle aches, chest pain, shortness of breath, mood changes.  Positive headaches  Objective  Blood pressure 122/72, pulse 89, height 5' 3.5" (1.613 m), weight 114 lb (51.7 kg), last menstrual period 02/12/2018, SpO2 98 %.   General: No apparent distress alert and oriented x3 mood and affect normal, dressed appropriately.  HEENT: Pupils equal, extraocular movements intact  Respiratory: Patient's speak in full sentences and does not appear short of breath  Cardiovascular: No lower extremity edema, non tender, no erythema  Skin: Warm dry intact with no signs of infection or rash on extremities or on axial skeleton.  Abdomen: Soft nontender  Neuro: Cranial nerves II through XII are intact, neurovascularly intact in all extremities with 2+ DTRs and 2+ pulses.  Lymph: No lymphadenopathy of posterior or anterior cervical chain or axillae bilaterally.  Gait normal with good balance and coordination.  MSK:  Non tender with full range of motion and good stability and symmetric strength and tone of shoulders, elbows, wrist, hip, knee and ankles bilaterally.  Neck: Inspection loss of lordosis. No palpable stepoffs. Negative Spurling's maneuver.  Mild limited range of motion sidebending bilaterally 5 degrees bilaterally.  Lacks last 10 degrees of extension Grip strength and sensation normal in bilateral hands Strength good C4 to T1 distribution No sensory change to C4 to T1 Negative Hoffman sign bilaterally Reflexes normal  Osteopathic findings C2 flexed rotated and side bent right T3 extended rotated and side bent right inhaled third rib L5 flexed rotated and side bent left  Sacrum right on right    Impression and Recommendations:     This case required medical decision making of  moderate complexity. The above documentation has been reviewed and is accurate and complete Lyndal Pulley, DO       Note: This dictation was prepared with Dragon dictation along with smaller phrase technology. Any transcriptional errors that result from this process are unintentional.

## 2018-03-04 ENCOUNTER — Ambulatory Visit: Payer: BLUE CROSS/BLUE SHIELD | Admitting: Family Medicine

## 2018-03-04 ENCOUNTER — Encounter: Payer: Self-pay | Admitting: Family Medicine

## 2018-03-04 VITALS — BP 122/72 | HR 89 | Ht 63.5 in | Wt 114.0 lb

## 2018-03-04 DIAGNOSIS — M999 Biomechanical lesion, unspecified: Secondary | ICD-10-CM

## 2018-03-04 DIAGNOSIS — M47892 Other spondylosis, cervical region: Secondary | ICD-10-CM | POA: Diagnosis not present

## 2018-03-04 MED ORDER — THYROID 60 MG PO TABS: 60.0000 mg | ORAL_TABLET | Freq: Every day | ORAL | 3 refills | Status: DC

## 2018-03-04 NOTE — Patient Instructions (Signed)
Good to se you  We refilled everything  Ice is your fried See me again  When you can

## 2018-03-04 NOTE — Assessment & Plan Note (Signed)
Moderate degenerative disc disease of the cervical spine.  Discussed icing regimen and home exercise.  Discussed which activities doing which wants to avoid.  Patient is to slowly increase activity over the course of next several days.  Patient is going to have some difficulty with her insurance.  We discussed with her that we will see her whenever she is needs this.  Patient will follow-up with [place] with any type of pain.  Continue same medications.

## 2018-03-04 NOTE — Assessment & Plan Note (Signed)
Decision today to treat with OMT was based on Physical Exam  After verbal consent patient was treated with HVLA, ME, FPR techniques in cervical, thoracic, rib lumbar and sacral areas  Patient tolerated the procedure well with improvement in symptoms  Patient given exercises, stretches and lifestyle modifications  See medications in patient instructions if given  Patient will follow up in 4-8 weeks 

## 2018-03-05 NOTE — Progress Notes (Signed)
GYNECOLOGY  VISIT   HPI: 57 y.o.   Married White or Caucasian Not Hispanic or Latino  female   [phone] with Patient's last menstrual period was 02/12/2018 (approximate).   here for  2 week post op from hysteroscopy, polypectomy, D&C. Pathology with secretory endometrium and benign polyp. Labs from other provider reviewed. Her estradiol was in a pre-menopausal range. U/S prior to surgery showed a 2.8 cm left simple ovarian cyst, given lab work she is still peri-menopausal.  Overall she is doing well since surgery. Light bleeding/spotting the first week. None since. She has an occasional cramps. No pain.   GYNECOLOGIC HISTORY: Patient's last menstrual period was 02/12/2018 (approximate). Contraception: Spouse has a vasectomy Menopausal hormone therapy: None        OB History    Gravida  3   Para  3   Term  3   Preterm  0   AB  0   Living  3     SAB  0   TAB  0   Ectopic  0   Multiple  0   Live Births  3              Patient Active Problem List   Diagnosis Date Noted  . Hypothyroid   . Frozen shoulder 04/10/2017  . Migraine 11/01/2015  . DJD (degenerative joint disease), cervical 10/06/2015  . Nonallopathic lesion of cervical region 10/06/2015  . Nonallopathic lesion of thoracic region 10/06/2015  . Nonallopathic lesion of lumbosacral region 10/06/2015  . Unspecified constipation 02/10/2013  . Fibroids 02/10/2013  . Urinary incontinence 02/10/2013    Past Medical History:  Diagnosis Date  . Abnormal uterine bleeding (AUB)   . Cervical osteoarthritis    Neck  . Endometrial polyp   . Fibroids, intramural 2014  . Frozen shoulder, right   . Hypothyroidism    Originally Dx by Endocrinology in 2009  . Migraines   . PONV (postoperative nausea and vomiting)   . Urinary incontinence, stress   . Wears glasses    Reading    Past Surgical History:  Procedure Laterality Date  . BREAST ENHANCEMENT SURGERY    . COLONOSCOPY  04/2017  . DILATATION &  CURETTAGE/HYSTEROSCOPY WITH MYOSURE N/A 02/23/2018   Procedure: DILATATION & CURETTAGE/HYSTEROSCOPY WITH MYOSURE;  Surgeon: Salvadore Dom, MD;  Location: [facility];  Service: Gynecology;  Laterality: N/A;  . FACIAL COSMETIC SURGERY    . NASAL SEPTUM SURGERY    . WISDOM TOOTH EXTRACTION      Current Outpatient Medications  Medication Sig Dispense Refill  . Cyanocobalamin (VITAMIN B 12 PO) Take by mouth.    . magnesium 30 MG tablet Take 30 mg by mouth daily.    . Multiple Vitamin (MULTI-VITAMINS PO) Take by mouth daily.    . Pregnenolone POWD by Does not apply route.    . Probiotic Product (PROBIOTIC PO) Take by mouth daily.    Marland Kitchen testosterone (ANDROGEL) 50 MG/5GM (1%) GEL Place 5 g onto the skin daily.    Marland Kitchen thyroid (ARMOUR THYROID) 60 MG tablet Take 1 tablet (60 mg total) by mouth daily before breakfast. 90 tablet 3  . TURMERIC PO Take by mouth.    . Vitamin D-Vitamin K (K2 PLUS D3 PO) Take by mouth.    . Zinc Sulfate (ZINC 15 PO) Take by mouth.     No current facility-administered medications for this visit.      ALLERGIES: Gluten meal and Lactose intolerance (gi)  Family History  Problem Relation Age of Onset  . Colon polyps Father   . Heart disease Father 32  . Stroke Father   . Diabetes Mother   . Stroke Maternal Grandfather   . Colon cancer Neg Hx     Social History   Socioeconomic History  . Marital status: Married    Spouse name: Not on file  . Number of children: 3  . Years of education: Not on file  . Highest education level: Not on file  Occupational History  . Occupation: Cabin crew  Social Needs  . Financial resource strain: Not on file  . Food insecurity:    Worry: Not on file    Inability: Not on file  . Transportation needs:    Medical: Not on file    Non-medical: Not on file  Tobacco Use  . Smoking status: Never Smoker  . Smokeless tobacco: Never Used  Substance and Sexual Activity  . Alcohol use: No    Alcohol/week: 0.0  standard drinks  . Drug use: No  . Sexual activity: Yes    Partners: Male    Birth control/protection: Other-see comments    Comment: Husband - vasectomy  Lifestyle  . Physical activity:    Days per week: 0 days    Minutes per session: 0 min  . Stress: Not at all  Relationships  . Social connections:    Talks on phone: Not on file    Gets together: Not on file    Attends religious service: Not on file    Active member of club or organization: Not on file    Attends meetings of clubs or organizations: Not on file    Relationship status: Not on file  . Intimate partner violence:    Fear of current or ex partner: Not on file    Emotionally abused: Not on file    Physically abused: Not on file    Forced sexual activity: Not on file  Other Topics Concern  . Not on file  Social History Narrative  . Not on file    Review of Systems  Constitutional: Negative.   HENT: Negative.   Eyes: Negative.   Respiratory: Negative.   Cardiovascular: Negative.   Gastrointestinal: Negative.   Genitourinary: Negative.   Musculoskeletal: Negative.   Skin: Negative.   Neurological: Negative.   Endo/Heme/Allergies: Negative.   Psychiatric/Behavioral: Negative.     PHYSICAL EXAMINATION:    BP 108/64 (BP Location: Right Arm, Patient Position: Sitting, Cuff Size: Normal)   Pulse 68   Wt 115 lb 3.2 oz (52.3 kg)   LMP 02/12/2018 (Approximate) Comment: irregular bleeding   BMI 20.09 kg/m     General appearance: alert, cooperative and appears stated age Abdomen: soft, non-tender; non distended, no masses,  no organomegaly   ASSESSMENT Abnormal perimenopausal bleeding. S/P hysteroscopy, polypectomy, D&C with benign pathology. Still with hormonal stimulation and pre-menopausal estradiol level     PLAN Cyclic provera She will calendar when she takes the provera and any bleeding in the next 6 months, then send for review.    An After Visit Summary was printed and given to the  patient.  ~15 minutes face to face time of which over 50% was spent in counseling.

## 2018-03-09 ENCOUNTER — Ambulatory Visit (INDEPENDENT_AMBULATORY_CARE_PROVIDER_SITE_OTHER): Payer: BLUE CROSS/BLUE SHIELD | Admitting: Obstetrics and Gynecology

## 2018-03-09 ENCOUNTER — Encounter: Payer: Self-pay | Admitting: Obstetrics and Gynecology

## 2018-03-09 VITALS — BP 108/64 | HR 68 | Wt 115.2 lb

## 2018-03-09 DIAGNOSIS — N951 Menopausal and female climacteric states: Secondary | ICD-10-CM

## 2018-03-09 DIAGNOSIS — N939 Abnormal uterine and vaginal bleeding, unspecified: Secondary | ICD-10-CM | POA: Diagnosis not present

## 2018-03-09 MED ORDER — MEDROXYPROGESTERONE ACETATE 5 MG PO TABS: ORAL_TABLET | ORAL | 1 refills | Status: DC

## 2018-03-17 ENCOUNTER — Telehealth: Payer: Self-pay | Admitting: Obstetrics and Gynecology

## 2018-03-17 NOTE — Telephone Encounter (Signed)
Patient is out of town and says she is sure she has a uti. Asking if something can be called in to the [facility] in [place]. Pharmacy number is [phone].

## 2018-03-17 NOTE — Telephone Encounter (Signed)
Spoke with patient. Patient reports urinary frequency and dysuria. Patient is in [place] for a conference, requesting RX. Does not return to [place] until Monday. Patient states she was seen on 03/09/18, no urinary symptoms present at that time. Has increased fluid intake.   Denies lower back pain, fever/chills, blood in urine, N/V, vaginal d/c.  Advised patient will need OV for further evaluation and Tx. Recommended patient be evaluated at local Urgent Care/ER. Assisted patient with locating local urgent care. Patient aware to return call if f/u is needed or if symptoms do not resolve.  Routing to provider for final review. Patient is agreeable to disposition. Will close encounter.

## 2018-05-01 DIAGNOSIS — R5383 Other fatigue: Secondary | ICD-10-CM | POA: Diagnosis not present

## 2018-05-01 DIAGNOSIS — N951 Menopausal and female climacteric states: Secondary | ICD-10-CM | POA: Diagnosis not present

## 2018-05-01 DIAGNOSIS — E039 Hypothyroidism, unspecified: Secondary | ICD-10-CM | POA: Diagnosis not present

## 2018-05-19 DIAGNOSIS — N959 Unspecified menopausal and perimenopausal disorder: Secondary | ICD-10-CM | POA: Diagnosis not present

## 2018-05-19 DIAGNOSIS — E039 Hypothyroidism, unspecified: Secondary | ICD-10-CM | POA: Diagnosis not present

## 2018-05-19 DIAGNOSIS — R5383 Other fatigue: Secondary | ICD-10-CM | POA: Diagnosis not present

## 2018-06-24 ENCOUNTER — Ambulatory Visit: Payer: BLUE CROSS/BLUE SHIELD | Admitting: Family Medicine

## 2018-06-24 VITALS — BP 112/76 | HR 78 | Ht 63.0 in | Wt 121.0 lb

## 2018-06-24 DIAGNOSIS — M999 Biomechanical lesion, unspecified: Secondary | ICD-10-CM | POA: Diagnosis not present

## 2018-06-24 DIAGNOSIS — M47892 Other spondylosis, cervical region: Secondary | ICD-10-CM

## 2018-06-24 NOTE — Assessment & Plan Note (Addendum)

## 2018-06-24 NOTE — Progress Notes (Signed)
[name] [address], [place] [address] Phone: [phone] Subjective:   Anna Young, am serving as a scribe for Dr. Hulan Saas.   CC: Back pain follow-up  [id number]  Anna Young is a 58 y.o. female coming in with complaint of back pain. Patient was last seen in October 2019 due to a change in her insurance. Patient has been having cervical spine tightness and an increase in her migraines due to tightness. Is here for OMT today to manage her symptoms.      Past Medical History:  Diagnosis Date  . Abnormal uterine bleeding (AUB)   . Cervical osteoarthritis    Neck  . Endometrial polyp   . Fibroids, intramural 2014  . Frozen shoulder, right   . Hypothyroidism    Originally Dx by Endocrinology in 2009  . Migraines   . PONV (postoperative nausea and vomiting)   . Urinary incontinence, stress   . Wears glasses    Reading   Past Surgical History:  Procedure Laterality Date  . BREAST ENHANCEMENT SURGERY    . COLONOSCOPY  04/2017  . DILATATION & CURETTAGE/HYSTEROSCOPY WITH MYOSURE N/A 02/23/2018   Procedure: DILATATION & CURETTAGE/HYSTEROSCOPY WITH MYOSURE;  Surgeon: Salvadore Dom, MD;  Location: [facility];  Service: Gynecology;  Laterality: N/A;  . FACIAL COSMETIC SURGERY    . NASAL SEPTUM SURGERY    . WISDOM TOOTH EXTRACTION     Social History   Socioeconomic History  . Marital status: Married    Spouse name: Not on file  . Number of children: 3  . Years of education: Not on file  . Highest education level: Not on file  Occupational History  . Occupation: Cabin crew  Social Needs  . Financial resource strain: Not on file  . Food insecurity:    Worry: Not on file    Inability: Not on file  . Transportation needs:    Medical: Not on file    Non-medical: Not on file  Tobacco Use  . Smoking status: Never Smoker  . Smokeless tobacco: Never Used  Substance and Sexual Activity  .  Alcohol use: Young    Alcohol/week: 0.0 standard drinks  . Drug use: Young  . Sexual activity: Yes    Partners: Male    Birth control/protection: Other-see comments    Comment: Husband - vasectomy  Lifestyle  . Physical activity:    Days per week: 0 days    Minutes per session: 0 min  . Stress: Not at all  Relationships  . Social connections:    Talks on phone: Not on file    Gets together: Not on file    Attends religious service: Not on file    Active member of club or organization: Not on file    Attends meetings of clubs or organizations: Not on file    Relationship status: Not on file  Other Topics Concern  . Not on file  Social History Narrative  . Not on file   Allergies  Allergen Reactions  . Gluten Meal Other (See Comments)    Abdominal pain  . Lactose Intolerance (Gi)    Family History  Problem Relation Age of Onset  . Colon polyps Father   . Heart disease Father 90  . Stroke Father   . Diabetes Mother   . Stroke Maternal Grandfather   . Colon cancer Neg Hx     Current Outpatient Medications (Endocrine & Metabolic):  .  medroxyPROGESTERone (PROVERA) 5 MG tablet, Take one tablet a day every other month for 5 days. Start on 04/12/18 .  progesterone (PROMETRIUM) 100 MG capsule, Take 100 mg by mouth daily. Marland Kitchen  testosterone (ANDROGEL) 50 MG/5GM (1%) GEL, Place 5 g onto the skin daily. Marland Kitchen  thyroid (ARMOUR THYROID) 60 MG tablet, Take 1 tablet (60 mg total) by mouth daily before breakfast.     Current Outpatient Medications (Hematological):  Marland Kitchen  Cyanocobalamin (VITAMIN B 12 PO), Take by mouth.  Current Outpatient Medications (Other):  .  magnesium 30 MG tablet, Take 30 mg by mouth daily. .  Multiple Vitamin (MULTI-VITAMINS PO), Take by mouth daily. .  Pregnenolone POWD, by Does not apply route. .  Probiotic Product (PROBIOTIC PO), Take by mouth daily. .  TURMERIC PO, Take by mouth. .  Vitamin D-Vitamin K (K2 PLUS D3 PO), Take by mouth. .  Zinc Sulfate (ZINC 15 PO),  Take by mouth.    Past medical history, social, surgical and family history all reviewed in electronic medical record.  Young pertanent information unless stated regarding to the chief complaint.   Review of Systems:  Young , visual changes, nausea, vomiting, diarrhea, constipation, dizziness, abdominal pain, skin rash, fevers, chills, night sweats, weight loss, swollen lymph nodes, body aches, joint swelling, muscle aches, chest pain, shortness of breath, mood changes.  Positive headaches  Objective  Blood pressure 112/76, pulse 78, height 5\' 3"  (1.6 m), weight 121 lb (54.9 kg), SpO2 98 %.   General: Young apparent distress alert and oriented x3 mood and affect normal, dressed appropriately.  HEENT: Pupils equal, extraocular movements intact  Respiratory: Patient's speak in full sentences and does not appear short of breath  Cardiovascular: Young lower extremity edema, non tender, Young erythema  Skin: Warm dry intact with Young signs of infection or rash on extremities or on axial skeleton.  Abdomen: Soft nontender  Neuro: Cranial nerves II through XII are intact, neurovascularly intact in all extremities with 2+ DTRs and 2+ pulses.  Lymph: Young lymphadenopathy of posterior or anterior cervical chain or axillae bilaterally.  Gait normal with good balance and coordination.  MSK:  Non tender with full range of motion and good stability and symmetric strength and tone of shoulders, elbows, wrist, hip, knee and ankles bilaterally.  Hypermobility noted.  Patient does have hypermobility.  Does have some mild crepitus on the neck noted.  Patient has a negative Spurling's today.  Mild tightness with left-sided rotation compared to patient's baseline.  Neurovascular intact with deep tendon reflexes 2+ and symmetric  Osteopathic findings C2 flexed rotated and side bent right C4 flexed rotated and side bent left C6 flexed rotated and side bent left T5 extended rotated and side bent right  L1 F RS right      Impression and Recommendations:     This case required medical decision making of moderate complexity. The above documentation has been reviewed and is accurate and complete Lyndal Pulley, DO       Note: This dictation was prepared with Dragon dictation along with smaller phrase technology. Any transcriptional errors that result from this process are unintentional.

## 2018-06-24 NOTE — Assessment & Plan Note (Signed)
Stable.  His underlying condition likely contributing to some of the headaches.  Some of the pain.  Has responded well to osteopathic manipulation and home exercises.  Follow-up again in 4 to 8 weeks

## 2018-06-24 NOTE — Patient Instructions (Signed)
Good to see you  Ice is good Stay active Posture is key  See me again in 4-5 weeks

## 2018-07-14 DIAGNOSIS — R5383 Other fatigue: Secondary | ICD-10-CM | POA: Diagnosis not present

## 2018-07-14 DIAGNOSIS — E039 Hypothyroidism, unspecified: Secondary | ICD-10-CM | POA: Diagnosis not present

## 2018-07-14 DIAGNOSIS — N951 Menopausal and female climacteric states: Secondary | ICD-10-CM | POA: Diagnosis not present

## 2018-07-22 ENCOUNTER — Encounter: Payer: Self-pay | Admitting: Physician Assistant

## 2018-07-22 ENCOUNTER — Ambulatory Visit: Payer: BLUE CROSS/BLUE SHIELD | Admitting: Physician Assistant

## 2018-07-22 ENCOUNTER — Encounter: Payer: Self-pay | Admitting: Family Medicine

## 2018-07-22 ENCOUNTER — Ambulatory Visit: Payer: BLUE CROSS/BLUE SHIELD | Admitting: Family Medicine

## 2018-07-22 ENCOUNTER — Other Ambulatory Visit: Payer: Self-pay

## 2018-07-22 VITALS — BP 94/70 | HR 73 | Temp 98.0°F | Ht 63.0 in | Wt 120.5 lb

## 2018-07-22 VITALS — BP 124/82 | HR 74 | Ht 63.0 in | Wt 122.0 lb

## 2018-07-22 DIAGNOSIS — R05 Cough: Secondary | ICD-10-CM

## 2018-07-22 DIAGNOSIS — R059 Cough, unspecified: Secondary | ICD-10-CM

## 2018-07-22 DIAGNOSIS — M999 Biomechanical lesion, unspecified: Secondary | ICD-10-CM

## 2018-07-22 DIAGNOSIS — M47892 Other spondylosis, cervical region: Secondary | ICD-10-CM

## 2018-07-22 MED ORDER — BUDESONIDE-FORMOTEROL FUMARATE 80-4.5 MCG/ACT IN AERO: 2.0000 | INHALATION_SPRAY | Freq: Two times a day (BID) | RESPIRATORY_TRACT | 3 refills | Status: DC

## 2018-07-22 MED ORDER — IPRATROPIUM BROMIDE 0.03 % NA SOLN: 2.0000 | Freq: Two times a day (BID) | NASAL | 1 refills | Status: DC

## 2018-07-22 NOTE — Progress Notes (Signed)
[facility] [place], [place] [address] Phone: [phone] Subjective:      CC: Neck pain  [id number]  Anna Young is a 58 y.o. female coming in with complaint of neck pain. Patient used a massage chair which made her neck pain increase a few days ago. She is doing better today but still feels like her neck is out of place.  States that some tightness overall.  No radiation.  Discussed which are Tuesday which was avoided     Past Medical History:  Diagnosis Date  . Abnormal uterine bleeding (AUB)   . Cervical osteoarthritis    Neck  . Endometrial polyp   . Fibroids, intramural 2014  . Frozen shoulder, right   . Hypothyroidism    Originally Dx by Endocrinology in 2009  . Migraines   . PONV (postoperative nausea and vomiting)   . Urinary incontinence, stress   . Wears glasses    Reading   Past Surgical History:  Procedure Laterality Date  . BREAST ENHANCEMENT SURGERY    . COLONOSCOPY  04/2017  . DILATATION & CURETTAGE/HYSTEROSCOPY WITH MYOSURE N/A 02/23/2018   Procedure: DILATATION & CURETTAGE/HYSTEROSCOPY WITH MYOSURE;  Surgeon: Salvadore Dom, MD;  Location: [facility];  Service: Gynecology;  Laterality: N/A;  . FACIAL COSMETIC SURGERY    . NASAL SEPTUM SURGERY    . WISDOM TOOTH EXTRACTION     Social History   Socioeconomic History  . Marital status: Married    Spouse name: Not on file  . Number of children: 3  . Years of education: Not on file  . Highest education level: Not on file  Occupational History  . Occupation: Cabin crew  Social Needs  . Financial resource strain: Not on file  . Food insecurity:    Worry: Not on file    Inability: Not on file  . Transportation needs:    Medical: Not on file    Non-medical: Not on file  Tobacco Use  . Smoking status: Never Smoker  . Smokeless tobacco: Never Used  Substance and Sexual Activity  . Alcohol use: No    Alcohol/week: 0.0 standard  drinks  . Drug use: No  . Sexual activity: Yes    Partners: Male    Birth control/protection: Other-see comments    Comment: Husband - vasectomy  Lifestyle  . Physical activity:    Days per week: 0 days    Minutes per session: 0 min  . Stress: Not at all  Relationships  . Social connections:    Talks on phone: Not on file    Gets together: Not on file    Attends religious service: Not on file    Active member of club or organization: Not on file    Attends meetings of clubs or organizations: Not on file    Relationship status: Not on file  Other Topics Concern  . Not on file  Social History Narrative  . Not on file   Allergies  Allergen Reactions  . Gluten Meal Other (See Comments)    Abdominal pain  . Lactose Intolerance (Gi)    Family History  Problem Relation Age of Onset  . Colon polyps Father   . Heart disease Father 37  . Stroke Father   . Diabetes Mother   . Stroke Maternal Grandfather   . Colon cancer Neg Hx     Current Outpatient Medications (Endocrine & Metabolic):  .  medroxyPROGESTERone (PROVERA) 5 MG  tablet, Take one tablet a day every other month for 5 days. Start on 04/12/18 .  progesterone (PROMETRIUM) 100 MG capsule, Take 100 mg by mouth daily. Marland Kitchen  testosterone (ANDROGEL) 50 MG/5GM (1%) GEL, Place 5 g onto the skin daily. Marland Kitchen  thyroid (ARMOUR THYROID) 60 MG tablet, Take 1 tablet (60 mg total) by mouth daily before breakfast.   Current Outpatient Medications (Respiratory):  .  budesonide-formoterol (SYMBICORT) 80-4.5 MCG/ACT inhaler, Inhale 2 puffs into the lungs 2 (two) times daily. Marland Kitchen  ipratropium (ATROVENT) 0.03 % nasal spray, Place 2 sprays into both nostrils every 12 (twelve) hours.   Current Outpatient Medications (Hematological):  Marland Kitchen  Cyanocobalamin (VITAMIN B 12 PO), Take by mouth.  Current Outpatient Medications (Other):  .  magnesium 30 MG tablet, Take 30 mg by mouth daily. .  Multiple Vitamin (MULTI-VITAMINS PO), Take by mouth daily. .   Pregnenolone POWD, by Does not apply route. .  Probiotic Product (PROBIOTIC PO), Take by mouth daily. .  TURMERIC PO, Take by mouth. .  Vitamin D-Vitamin K (K2 PLUS D3 PO), Take by mouth. .  Zinc Sulfate (ZINC 15 PO), Take by mouth.    Past medical history, social, surgical and family history all reviewed in electronic medical record.  No pertanent information unless stated regarding to the chief complaint.   Review of Systems:  No headache, visual changes, nausea, vomiting, diarrhea, constipation, dizziness, abdominal pain, skin rash, fevers, chills, night sweats, weight loss, swollen lymph nodes, body aches, joint swelling,  chest pain, shortness of breath, mood changes.  Positive muscle aches  Objective  Blood pressure 124/82, pulse 74, height 5\' 3"  (1.6 m), weight 122 lb (55.3 kg), last menstrual period 06/25/2018, SpO2 98 %.    General: No apparent distress alert and oriented x3 mood and affect normal, dressed appropriately.  HEENT: Pupils equal, extraocular movements intact  Respiratory: Patient's speak in full sentences and does not appear short of breath  Cardiovascular: No lower extremity edema, non tender, no erythema  Skin: Warm dry intact with no signs of infection or rash on extremities or on axial skeleton.  Abdomen: Soft nontender  Neuro: Cranial nerves II through XII are intact, neurovascularly intact in all extremities with 2+ DTRs and 2+ pulses.  Lymph: No lymphadenopathy of posterior or anterior cervical chain or axillae bilaterally.  Gait normal with good balance and coordination.  MSK:  Non tender with full range of motion and good stability and symmetric strength and tone of shoulders, elbows, wrist, hip, knee and ankles bilaterally.  Hypermobility noted Neck: Inspection loss of lordosis. No palpable stepoffs. Negative Spurling's maneuver. Bending range of motion in all planes Grip strength and sensation normal in bilateral hands Strength good C4 to T1  distribution No sensory change to C4 to T1 Negative Hoffman sign bilaterally Reflexes normal Tightness of the left trapezius  Osteopathic findings C4 flexed rotated and side bent left C7 flexed rotated and side bent left T3 extended rotated and side bent left inhaled third rib T5 extended rotated and side bent left L3 flexed rotated and side bent right     Impression and Recommendations:     This case required medical decision making of moderate complexity. The above documentation has been reviewed and is accurate and complete Lyndal Pulley, DO       Note: This dictation was prepared with Dragon dictation along with smaller phrase technology. Any transcriptional errors that result from this process are unintentional.

## 2018-07-22 NOTE — Assessment & Plan Note (Signed)
Decision today to treat with OMT was based on Physical Exam  After verbal consent patient was treated with HVLA, ME, FPR techniques in cervical, thoracic, lumbar and sacral areas  Patient tolerated the procedure well with improvement in symptoms  Patient given exercises, stretches and lifestyle modifications  See medications in patient instructions if given  Patient will follow up in 6 weeks 

## 2018-07-22 NOTE — Assessment & Plan Note (Signed)
Patient does have some arm tightness.  Known degenerative disc disease.  Responds well to manipulation.  No significant changes otherwise.  Follow-up again in 4 to 8 weeks

## 2018-07-22 NOTE — Patient Instructions (Signed)
You are awesome Stay active I am impressed Stay away from things that hit you ;) See me again in 6ish weeks

## 2018-07-22 NOTE — Progress Notes (Signed)
Anna Young is a 58 y.o. female here for a new problem.  History of Present Illness:   Chief Complaint  Patient presents with  . Chest congestion  . Cough    x2 months    HPI   Patient reports that she had what she thinks was the flu back in December.  Since that time she has had an annoying lingering cough.  She is in real estate. Sometimes her cough is dry, sometimes its slightly productive. Has slight post-nasal drip. Denies any concerns for GERD.  Tried mucinex x 1 but vomited. Stomach is usually sensitive to cold medications.  Denies: chest pain, SOB, sore throat, ear pain, hx asthma/PNA  Past Medical History:  Diagnosis Date  . Abnormal uterine bleeding (AUB)   . Cervical osteoarthritis    Neck  . Endometrial polyp   . Fibroids, intramural 2014  . Frozen shoulder, right   . Hypothyroidism    Originally Dx by Endocrinology in 2009  . Migraines   . PONV (postoperative nausea and vomiting)   . Urinary incontinence, stress   . Wears glasses    Reading     Social History   Socioeconomic History  . Marital status: Married    Spouse name: Not on file  . Number of children: 3  . Years of education: Not on file  . Highest education level: Not on file  Occupational History  . Occupation: Cabin crew  Social Needs  . Financial resource strain: Not on file  . Food insecurity:    Worry: Not on file    Inability: Not on file  . Transportation needs:    Medical: Not on file    Non-medical: Not on file  Tobacco Use  . Smoking status: Never Smoker  . Smokeless tobacco: Never Used  Substance and Sexual Activity  . Alcohol use: No    Alcohol/week: 0.0 standard drinks  . Drug use: No  . Sexual activity: Yes    Partners: Male    Birth control/protection: Other-see comments    Comment: Husband - vasectomy  Lifestyle  . Physical activity:    Days per week: 0 days    Minutes per session: 0 min  . Stress: Not at all  Relationships  . Social connections:   Talks on phone: Not on file    Gets together: Not on file    Attends religious service: Not on file    Active member of club or organization: Not on file    Attends meetings of clubs or organizations: Not on file    Relationship status: Not on file  . Intimate partner violence:    Fear of current or ex partner: Not on file    Emotionally abused: Not on file    Physically abused: Not on file    Forced sexual activity: Not on file  Other Topics Concern  . Not on file  Social History Narrative  . Not on file    Past Surgical History:  Procedure Laterality Date  . BREAST ENHANCEMENT SURGERY    . COLONOSCOPY  04/2017  . DILATATION & CURETTAGE/HYSTEROSCOPY WITH MYOSURE N/A 02/23/2018   Procedure: DILATATION & CURETTAGE/HYSTEROSCOPY WITH MYOSURE;  Surgeon: Salvadore Dom, MD;  Location: [facility];  Service: Gynecology;  Laterality: N/A;  . FACIAL COSMETIC SURGERY    . NASAL SEPTUM SURGERY    . WISDOM TOOTH EXTRACTION      Family History  Problem Relation Age of Onset  . Colon polyps Father   .  Heart disease Father 50  . Stroke Father   . Diabetes Mother   . Stroke Maternal Grandfather   . Colon cancer Neg Hx     Allergies  Allergen Reactions  . Gluten Meal Other (See Comments)    Abdominal pain  . Lactose Intolerance (Gi)     Current Medications:   Current Outpatient Medications:  .  Cyanocobalamin (VITAMIN B 12 PO), Take by mouth., Disp: , Rfl:  .  magnesium 30 MG tablet, Take 30 mg by mouth daily., Disp: , Rfl:  .  Multiple Vitamin (MULTI-VITAMINS PO), Take by mouth daily., Disp: , Rfl:  .  Probiotic Product (PROBIOTIC PO), Take by mouth daily., Disp: , Rfl:  .  progesterone (PROMETRIUM) 100 MG capsule, Take 100 mg by mouth daily., Disp: , Rfl:  .  testosterone (ANDROGEL) 50 MG/5GM (1%) GEL, Place 5 g onto the skin daily., Disp: , Rfl:  .  thyroid (ARMOUR THYROID) 60 MG tablet, Take 1 tablet (60 mg total) by mouth daily before breakfast., Disp:  90 tablet, Rfl: 3 .  TURMERIC PO, Take by mouth., Disp: , Rfl:  .  Vitamin D-Vitamin K (K2 PLUS D3 PO), Take by mouth., Disp: , Rfl:  .  budesonide-formoterol (SYMBICORT) 80-4.5 MCG/ACT inhaler, Inhale 2 puffs into the lungs 2 (two) times daily., Disp: 1 Inhaler, Rfl: 3 .  ipratropium (ATROVENT) 0.03 % nasal spray, Place 2 sprays into both nostrils every 12 (twelve) hours., Disp: 30 mL, Rfl: 1 .  medroxyPROGESTERone (PROVERA) 5 MG tablet, Take one tablet a day every other month for 5 days. Start on 04/12/18 (Patient not taking: Reported on 07/22/2018), Disp: 15 tablet, Rfl: 1 .  Pregnenolone POWD, by Does not apply route., Disp: , Rfl:  .  Zinc Sulfate (ZINC 15 PO), Take by mouth., Disp: , Rfl:    Review of Systems:   Review of Systems  Constitutional: Negative for chills, fever, malaise/fatigue and weight loss.  Respiratory: Positive for cough. Negative for shortness of breath.   Cardiovascular: Negative for chest pain, orthopnea, claudication and leg swelling.  Gastrointestinal: Negative for heartburn, nausea and vomiting.  Neurological: Negative for dizziness, tingling and headaches.    Vitals:   Vitals:   07/22/18 1058  BP: 94/70  Pulse: 73  Temp: 98 F (36.7 C)  TempSrc: Oral  SpO2: 98%  Weight: 120 lb 8 oz (54.7 kg)  Height: 5\' 3"  (1.6 m)     Body mass index is 21.35 kg/m.  Physical Exam:   Physical Exam Vitals signs and nursing note reviewed.  Constitutional:      General: She is not in acute distress.    Appearance: She is well-developed. She is not ill-appearing or toxic-appearing.  HENT:     Head: Normocephalic and atraumatic.     Right Ear: Tympanic membrane, ear canal and external ear normal. Tympanic membrane is not erythematous, retracted or bulging.     Left Ear: Tympanic membrane, ear canal and external ear normal. Tympanic membrane is not erythematous, retracted or bulging.     Nose: Nose normal.     Right Sinus: No maxillary sinus tenderness or frontal  sinus tenderness.     Left Sinus: No maxillary sinus tenderness or frontal sinus tenderness.     Mouth/Throat:     Pharynx: Uvula midline. No posterior oropharyngeal erythema.  Eyes:     General: Lids are normal.     Conjunctiva/sclera: Conjunctivae normal.  Neck:     Trachea: Trachea normal.  Cardiovascular:  Rate and Rhythm: Normal rate and regular rhythm.     Heart sounds: Normal heart sounds, S1 normal and S2 normal.  Pulmonary:     Effort: Pulmonary effort is normal.     Breath sounds: Normal breath sounds. No decreased breath sounds, wheezing, rhonchi or rales.  Lymphadenopathy:     Cervical: No cervical adenopathy.  Skin:    General: Skin is warm and dry.  Neurological:     Mental Status: She is alert.  Psychiatric:        Speech: Speech normal.        Behavior: Behavior normal. Behavior is cooperative.      Assessment and Plan:   Jemya was seen today for chest congestion and cough.  Diagnoses and all orders for this visit:  Cough  Other orders -     ipratropium (ATROVENT) 0.03 % nasal spray; Place 2 sprays into both nostrils every 12 (twelve) hours. -     budesonide-formoterol (SYMBICORT) 80-4.5 MCG/ACT inhaler; Inhale 2 puffs into the lungs 2 (two) times daily.   Suspect post viral cough. Start atrovent nasal spray and symbicort inhaler. Follow-up in 2 weeks if no improvement, sooner if symptoms worsen.  . Reviewed expectations re: course of current medical issues. . Discussed self-management of symptoms. . Outlined signs and symptoms indicating need for more acute intervention. . Patient verbalized understanding and all questions were answered. . See orders for this visit as documented in the electronic medical record. . Patient received an After-Visit Summary.  CMA or LPN served as scribe during this visit. History, Physical, and Plan performed by medical provider. The above documentation has been reviewed and is accurate and complete.   Inda Coke, PA-C

## 2018-07-22 NOTE — Patient Instructions (Signed)
It was great to see you!  Start atrovent nasal spray daily.  Start symbicort inhaler in AM and PM.  Give this about two weeks, if no improvement or any worsening symptoms, please return so we can further evaluate.  Call clinic with questions.  I hope you start feeling better soon!

## 2018-08-12 DIAGNOSIS — E039 Hypothyroidism, unspecified: Secondary | ICD-10-CM | POA: Diagnosis not present

## 2018-08-12 DIAGNOSIS — N959 Unspecified menopausal and perimenopausal disorder: Secondary | ICD-10-CM | POA: Diagnosis not present

## 2018-08-12 DIAGNOSIS — R5383 Other fatigue: Secondary | ICD-10-CM | POA: Diagnosis not present

## 2018-08-13 ENCOUNTER — Other Ambulatory Visit: Payer: Self-pay | Admitting: Physician Assistant

## 2018-09-03 ENCOUNTER — Ambulatory Visit: Payer: BLUE CROSS/BLUE SHIELD | Admitting: Family Medicine

## 2018-09-05 ENCOUNTER — Other Ambulatory Visit: Payer: Self-pay | Admitting: Family Medicine

## 2018-10-07 NOTE — Progress Notes (Signed)
[name] [place], [place] [address] Phone: [phone] Subjective:   I Kandace Blitz am serving as a Education administrator for Dr. Hulan Saas.   CC:  Headache and neck pain   [id number]  Anna Young is a 58 y.o. female coming in with complaint of neck pain. Has been experiencing some headaches. Been some time migraines. More headaches recently  Has not been working out regularly      Past Medical History:  Diagnosis Date  . Abnormal uterine bleeding (AUB)   . Cervical osteoarthritis    Neck  . Endometrial polyp   . Fibroids, intramural 2014  . Frozen shoulder, right   . Hypothyroidism    Originally Dx by Endocrinology in 2009  . Migraines   . PONV (postoperative nausea and vomiting)   . Urinary incontinence, stress   . Wears glasses    Reading   Past Surgical History:  Procedure Laterality Date  . BREAST ENHANCEMENT SURGERY    . COLONOSCOPY  04/2017  . DILATATION & CURETTAGE/HYSTEROSCOPY WITH MYOSURE N/A 02/23/2018   Procedure: DILATATION & CURETTAGE/HYSTEROSCOPY WITH MYOSURE;  Surgeon: Salvadore Dom, MD;  Location: [facility];  Service: Gynecology;  Laterality: N/A;  . FACIAL COSMETIC SURGERY    . NASAL SEPTUM SURGERY    . WISDOM TOOTH EXTRACTION     Social History   Socioeconomic History  . Marital status: Married    Spouse name: Not on file  . Number of children: 3  . Years of education: Not on file  . Highest education level: Not on file  Occupational History  . Occupation: Cabin crew  Social Needs  . Financial resource strain: Not on file  . Food insecurity:    Worry: Not on file    Inability: Not on file  . Transportation needs:    Medical: Not on file    Non-medical: Not on file  Tobacco Use  . Smoking status: Never Smoker  . Smokeless tobacco: Never Used  Substance and Sexual Activity  . Alcohol use: No    Alcohol/week: 0.0 standard drinks  . Drug use: No  . Sexual activity:  Yes    Partners: Male    Birth control/protection: Other-see comments    Comment: Husband - vasectomy  Lifestyle  . Physical activity:    Days per week: 0 days    Minutes per session: 0 min  . Stress: Not at all  Relationships  . Social connections:    Talks on phone: Not on file    Gets together: Not on file    Attends religious service: Not on file    Active member of club or organization: Not on file    Attends meetings of clubs or organizations: Not on file    Relationship status: Not on file  Other Topics Concern  . Not on file  Social History Narrative  . Not on file   Allergies  Allergen Reactions  . Gluten Meal Other (See Comments)    Abdominal pain  . Lactose Intolerance (Gi)    Family History  Problem Relation Age of Onset  . Colon polyps Father   . Heart disease Father 76  . Stroke Father   . Diabetes Mother   . Stroke Maternal Grandfather   . Colon cancer Neg Hx     Current Outpatient Medications (Endocrine & Metabolic):  .  medroxyPROGESTERone (PROVERA) 5 MG tablet, Take one tablet a day every other month for 5 days.  Start on 04/12/18 .  progesterone (PROMETRIUM) 100 MG capsule, Take 100 mg by mouth daily. Marland Kitchen  testosterone (ANDROGEL) 50 MG/5GM (1%) GEL, Place 5 g onto the skin daily. Marland Kitchen  thyroid (ARMOUR THYROID) 60 MG tablet, Take 1 tablet (60 mg total) by mouth daily before breakfast.   Current Outpatient Medications (Respiratory):  .  budesonide-formoterol (SYMBICORT) 80-4.5 MCG/ACT inhaler, Inhale 2 puffs into the lungs 2 (two) times daily. Marland Kitchen  ipratropium (ATROVENT) 0.03 % nasal spray, PLACE 2 SPRAYS INTO BOTH NOSTRILS EVERY 12 (TWELVE) HOURS.   Current Outpatient Medications (Hematological):  Marland Kitchen  Cyanocobalamin (VITAMIN B 12 PO), Take by mouth.  Current Outpatient Medications (Other):  .  magnesium 30 MG tablet, Take 30 mg by mouth daily. .  Multiple Vitamin (MULTI-VITAMINS PO), Take by mouth daily. .  Pregnenolone POWD, by Does not apply route. .   Probiotic Product (PROBIOTIC PO), Take by mouth daily. .  TURMERIC PO, Take by mouth. .  Vitamin D-Vitamin K (K2 PLUS D3 PO), Take by mouth. .  Zinc Sulfate (ZINC 15 PO), Take by mouth.    Past medical history, social, surgical and family history all reviewed in electronic medical record.  No pertanent information unless stated regarding to the chief complaint.   Review of Systems:  No headache, visual changes, nausea, vomiting, diarrhea, constipation, dizziness, abdominal pain, skin rash, fevers, chills, night sweats, weight loss, swollen lymph nodes, body aches, joint swelling, muscle aches, chest pain, shortness of breath, mood changes.   Objective  There were no vitals taken for this visit. Systems examined below as of    General: No apparent distress alert and oriented x3 mood and affect normal, dressed appropriately.  HEENT: Pupils equal, extraocular movements intact  Respiratory: Patient's speak in full sentences and does not appear short of breath  Cardiovascular: No lower extremity edema, non tender, no erythema  Skin: Warm dry intact with no signs of infection or rash on extremities or on axial skeleton.  Abdomen: Soft nontender  Neuro: Cranial nerves II through XII are intact, neurovascularly intact in all extremities with 2+ DTRs and 2+ pulses.  Lymph: No lymphadenopathy of posterior or anterior cervical chain or axillae bilaterally.  Gait normal with good balance and coordination.  MSK:  Non tender with full range of motion and good stability and symmetric strength and tone of shoulders, elbows, wrist, hip, knee and ankles bilaterally.  Neck: Inspection loss of lordosis. No palpable stepoffs. Negative Spurling's maneuver. Limited range of motion lacking the last 5 degrees of sidebending and extension mild crepitus with range of motion Grip strength and sensation normal in bilateral hands Strength good C4 to T1 distribution No sensory change to C4 to T1 Negative Hoffman  sign bilaterally Reflexes normal Tightness of the trapezius bilaterally  Osteopathic findings  C2 flexed rotated and side bent right T3 extended rotated and side bent right inhaled third rib T6 extended rotated and side bent left      Impression and Recommendations:     This case required medical decision making of moderate complexity. The above documentation has been reviewed and is accurate and complete Lyndal Pulley, DO       Note: This dictation was prepared with Dragon dictation along with smaller phrase technology. Any transcriptional errors that result from this process are unintentional.

## 2018-10-08 ENCOUNTER — Ambulatory Visit (INDEPENDENT_AMBULATORY_CARE_PROVIDER_SITE_OTHER): Payer: BLUE CROSS/BLUE SHIELD | Admitting: Family Medicine

## 2018-10-08 ENCOUNTER — Other Ambulatory Visit: Payer: Self-pay

## 2018-10-08 ENCOUNTER — Encounter: Payer: Self-pay | Admitting: Family Medicine

## 2018-10-08 VITALS — BP 118/78 | HR 77 | Ht 63.0 in | Wt 122.0 lb

## 2018-10-08 DIAGNOSIS — M47892 Other spondylosis, cervical region: Secondary | ICD-10-CM | POA: Diagnosis not present

## 2018-10-08 DIAGNOSIS — M999 Biomechanical lesion, unspecified: Secondary | ICD-10-CM

## 2018-10-08 NOTE — Assessment & Plan Note (Signed)
Moderate to severe overall.  Has responded well to manipulation.  Patient did do very well today.  I do believe that it could be more of a cervicogenic headache and discussed over-the-counter medications that could be beneficial.  Responded well to manipulation.  Follow-up again in 4 to 8 weeks

## 2018-10-08 NOTE — Assessment & Plan Note (Signed)
Decision today to treat with OMT was based on Physical Exam  After verbal consent patient was treated with HVLA, ME, FPR techniques in cervical, thoracic, rib areas  Patient tolerated the procedure well with improvement in symptoms  Patient given exercises, stretches and lifestyle modifications  See medications in patient instructions if given  Patient will follow up in 4-8 weeks 

## 2018-10-08 NOTE — Patient Instructions (Signed)
Good to see you  Ice is your friend Stay active You should do well  coQ10 200-400mg  daily  See me again in 6 weeks

## 2018-10-25 ENCOUNTER — Other Ambulatory Visit: Payer: Self-pay | Admitting: Family Medicine

## 2018-11-16 ENCOUNTER — Other Ambulatory Visit: Payer: BC Managed Care – PPO

## 2018-11-16 ENCOUNTER — Other Ambulatory Visit: Payer: Self-pay

## 2018-11-16 DIAGNOSIS — Z20822 Contact with and (suspected) exposure to covid-19: Secondary | ICD-10-CM

## 2018-11-16 DIAGNOSIS — R6889 Other general symptoms and signs: Secondary | ICD-10-CM | POA: Diagnosis not present

## 2018-11-19 ENCOUNTER — Encounter (HOSPITAL_COMMUNITY): Payer: Self-pay | Admitting: Emergency Medicine

## 2018-11-19 ENCOUNTER — Ambulatory Visit: Payer: BC Managed Care – PPO | Admitting: Cardiovascular Disease

## 2018-11-19 ENCOUNTER — Emergency Department (HOSPITAL_COMMUNITY)
Admission: EM | Admit: 2018-11-19 | Discharge: 2018-11-19 | Disposition: A | Discharge status: Home / Self Care | Payer: BC Managed Care – PPO | Attending: Emergency Medicine | Admitting: Emergency Medicine

## 2018-11-19 ENCOUNTER — Other Ambulatory Visit: Payer: Self-pay

## 2018-11-19 ENCOUNTER — Ambulatory Visit: Payer: BLUE CROSS/BLUE SHIELD | Admitting: Family Medicine

## 2018-11-19 DIAGNOSIS — R002 Palpitations: Secondary | ICD-10-CM

## 2018-11-19 DIAGNOSIS — Z79899 Other long term (current) drug therapy: Secondary | ICD-10-CM | POA: Insufficient documentation

## 2018-11-19 DIAGNOSIS — E039 Hypothyroidism, unspecified: Secondary | ICD-10-CM | POA: Insufficient documentation

## 2018-11-19 LAB — CBC WITH DIFFERENTIAL/PLATELET
Abs Immature Granulocytes: 0.02 10*3/uL (ref 0.00–0.07)
Basophils Absolute: 0 10*3/uL (ref 0.0–0.1)
Basophils Relative: 1 %
Eosinophils Absolute: 0.2 10*3/uL (ref 0.0–0.5)
Eosinophils Relative: 3 %
HCT: 43.8 % (ref 36.0–46.0)
Hemoglobin: 14 g/dL (ref 12.0–15.0)
Immature Granulocytes: 0 %
Lymphocytes Relative: 16 %
Lymphs Abs: 1.1 10*3/uL (ref 0.7–4.0)
MCH: 29.5 pg (ref 26.0–34.0)
MCHC: 32 g/dL (ref 30.0–36.0)
MCV: 92.4 fL (ref 80.0–100.0)
Monocytes Absolute: 0.6 10*3/uL (ref 0.1–1.0)
Monocytes Relative: 10 %
Neutro Abs: 4.7 10*3/uL (ref 1.7–7.7)
Neutrophils Relative %: 70 %
Platelets: 521 10*3/uL — ABNORMAL HIGH (ref 150–400)
RBC: 4.74 MIL/uL (ref 3.87–5.11)
RDW: 12.7 % (ref 11.5–15.5)
WBC: 6.6 10*3/uL (ref 4.0–10.5)
nRBC: 0 % (ref 0.0–0.2)

## 2018-11-19 LAB — BASIC METABOLIC PANEL
Anion gap: 11 (ref 5–15)
BUN: 16 mg/dL (ref 6–20)
CO2: 28 mmol/L (ref 22–32)
Calcium: 9.9 mg/dL (ref 8.9–10.3)
Chloride: 102 mmol/L (ref 98–111)
Creatinine, Ser: 0.6 mg/dL (ref 0.44–1.00)
GFR calc Af Amer: >60 mL/min (ref >60)
GFR calc non Af Amer: >60 mL/min (ref >60)
Glucose, Bld: 90 mg/dL (ref 70–99)
Potassium: 3.9 mmol/L (ref 3.5–5.1)
Sodium: 141 mmol/L (ref 135–145)

## 2018-11-19 LAB — TSH: TSH: <0.01 u[IU]/mL — ABNORMAL LOW (ref 0.350–4.500)

## 2018-11-19 NOTE — Discharge Instructions (Signed)
Please follow up with your doctor for further evaluation.  You may benefit from Holter Monitoring for evaluation of heart palpitation.  Return to the ER if your condition worsen or if you have other concerns.

## 2018-11-19 NOTE — ED Provider Notes (Signed)
[facility] DEPT Provider Note   CSN: [id number] Arrival date & time: 11/19/18  1342     History   Chief Complaint Chief Complaint  Patient presents with  . elevated HR    HPI Anna Young is a 58 y.o. female.     The history is provided by the patient. No language interpreter was used.     58 year old female with history of abnormal uterine bleeding, hypothyroidism, migraine, presenting complaining of racing heart rate.  Patient states she was sitting at home when she noticed that her heart was racing, states that her Apple Watch was indicating that she has a heart rate of 160.  Her husband checked her pulse and states that it was fast.  At that time she denies any significant pain, no chest pain, lightheadedness, headache, dizziness, shortness of breath, diaphoresis numbness or weakness.  The elevated heart rate was waxing waning but lasting for a total of 30 minutes and since resolved.  She has never had similar episodes like this in the past.  She did mention developing a fever last week which has resolved.  She went to have a COVID testing done 5 days ago but it has not resulted yet.  Her son also had similar fever.  She however denies any runny nose sneezing coughing body aches other cold-like symptoms.  She has history of hypothyroidism but no change in her medication.  She denies any change in her diet, denies using energy drinks, and denies any increase in stress.  Everything else has been about the same.    Past Medical History:  Diagnosis Date  . Abnormal uterine bleeding (AUB)   . Cervical osteoarthritis    Neck  . Endometrial polyp   . Fibroids, intramural 2014  . Frozen shoulder, right   . Hypothyroidism    Originally Dx by Endocrinology in 2009  . Migraines   . PONV (postoperative nausea and vomiting)   . Urinary incontinence, stress   . Wears glasses    Reading    Patient Active Problem List   Diagnosis Date Noted  .  Hypothyroid   . Frozen shoulder 04/10/2017  . Migraine 11/01/2015  . DJD (degenerative joint disease), cervical 10/06/2015  . Nonallopathic lesion of cervical region 10/06/2015  . Nonallopathic lesion of thoracic region 10/06/2015  . Nonallopathic lesion of lumbosacral region 10/06/2015  . Unspecified constipation 02/10/2013  . Fibroids 02/10/2013  . Urinary incontinence 02/10/2013    Past Surgical History:  Procedure Laterality Date  . BREAST ENHANCEMENT SURGERY    . COLONOSCOPY  04/2017  . DILATATION & CURETTAGE/HYSTEROSCOPY WITH MYOSURE N/A 02/23/2018   Procedure: DILATATION & CURETTAGE/HYSTEROSCOPY WITH MYOSURE;  Surgeon: Salvadore Dom, MD;  Location: [facility];  Service: Gynecology;  Laterality: N/A;  . FACIAL COSMETIC SURGERY    . NASAL SEPTUM SURGERY    . WISDOM TOOTH EXTRACTION       OB History    Gravida  3   Para  3   Term  3   Preterm  0   AB  0   Living  3     SAB  0   TAB  0   Ectopic  0   Multiple  0   Live Births  3            Home Medications    Prior to Admission medications   Medication Sig Start Date End Date Taking? Authorizing Provider  Cyanocobalamin (VITAMIN B 12 PO)  Take by mouth.    [provider]  levothyroxine (SYNTHROID) 50 MCG tablet TAKE 1 TABLET BY MOUTH EVERY DAY 10/26/18   Lyndal Pulley, DO  magnesium 30 MG tablet Take 30 mg by mouth daily.    [provider]  Multiple Vitamin (MULTI-VITAMINS PO) Take by mouth daily.    [provider]  Pregnenolone POWD by Does not apply route.    [provider]  Probiotic Product (PROBIOTIC PO) Take by mouth daily.    [provider]  thyroid (ARMOUR THYROID) 60 MG tablet Take 1 tablet (60 mg total) by mouth daily before breakfast. 03/04/18   Lyndal Pulley, DO  TURMERIC PO Take by mouth.    [provider]  Vitamin D-Vitamin K (K2 PLUS D3 PO) Take by mouth.    [provider]  Zinc Sulfate  (ZINC 15 PO) Take by mouth.    [provider]    Family History Family History  Problem Relation Age of Onset  . Colon polyps Father   . Heart disease Father 41  . Stroke Father   . Diabetes Mother   . Stroke Maternal Grandfather   . Colon cancer Neg Hx     Social History Social History   Tobacco Use  . Smoking status: Never Smoker  . Smokeless tobacco: Never Used  Substance Use Topics  . Alcohol use: No    Alcohol/week: 0.0 standard drinks  . Drug use: No     Allergies   Gluten meal and Lactose intolerance (gi)   Review of Systems Review of Systems  All other systems reviewed and are negative.    Physical Exam Updated Vital Signs BP 135/85   Pulse (!) 102   Temp 98.4 F (36.9 C) (Oral)   Resp 18   SpO2 100%   Physical Exam Vitals signs and nursing note reviewed.  Constitutional:      General: She is not in acute distress.    Appearance: She is well-developed.  HENT:     Head: Atraumatic.  Eyes:     Conjunctiva/sclera: Conjunctivae normal.  Neck:     Musculoskeletal: Neck supple.     Vascular: No carotid bruit.  Cardiovascular:     Rate and Rhythm: Normal rate and regular rhythm.     Pulses: Normal pulses.     Heart sounds: Normal heart sounds.  Pulmonary:     Effort: Pulmonary effort is normal.     Breath sounds: Normal breath sounds. No wheezing, rhonchi or rales.  Abdominal:     Palpations: Abdomen is soft.     Tenderness: There is no abdominal tenderness.  Musculoskeletal:        General: No swelling.  Skin:    Findings: No rash.  Neurological:     Mental Status: She is alert and oriented to person, place, and time.      ED Treatments / Results  Labs (all labs ordered are listed, but only abnormal results are displayed) Labs Reviewed  CBC WITH DIFFERENTIAL/PLATELET - Abnormal; Notable for the following components:      Result Value   Platelets 521 (*)    All other components within normal limits  BASIC METABOLIC PANEL     EKG EKG Interpretation  Date/Time:  Thursday November 19 2018 13:50:31 EDT Ventricular Rate:  103 PR Interval:    QRS Duration: 87 QT Interval:  341 QTC Calculation: 447 R Axis:   107 Text Interpretation:  Sinus tachycardia Consider right ventricular hypertrophy Confirmed  by Nat Christen [phone]) on 11/19/2018 3:02:34 PM   Radiology No results found.  Procedures Procedures (including critical care time)  Medications Ordered in ED Medications - No data to display   Initial Impression / Assessment and Plan / ED Course  I have reviewed the triage vital signs and the nursing notes.  Pertinent labs & imaging results that were available during my care of the patient were reviewed by me and considered in my medical decision making (see chart for details).        BP 109/81 (BP Location: Left Arm)   Pulse 95   Temp 98.4 F (36.9 C) (Oral)   Resp 18   SpO2 97%    Final Clinical Impressions(s) / ED Diagnoses   Final diagnoses:  Heart palpitations    ED Discharge Orders    None     4:34 PM Pt here with complaints of heart palpitation that was transient.  EKG today shows sinus tachycardia without any concerning arrhythmia.  Labs are reassuring.  Patient without any active symptoms at this time.  She will benefit from outpatient follow-up with Holter monitoring for further evaluation.  Patient is stable for discharge.  Return precaution discussed.   Domenic Moras, PA-C 11/19/18 1640    Nat Christen, MD 11/22/18 [phone]

## 2018-11-19 NOTE — ED Triage Notes (Signed)
Per pt, states she had a "rapid HR" lasting about a 1/2 hour-states no CP-was tested on Monday for Covid due to having symptoms last week-no symptoms now-was told by a Cardiologist who is a friend to come to ED for EKG-no new meds

## 2018-11-21 LAB — NOVEL CORONAVIRUS, NAA: SARS-CoV-2, NAA: NOT DETECTED

## 2018-12-04 ENCOUNTER — Encounter: Payer: Self-pay | Admitting: Obstetrics and Gynecology

## 2018-12-04 DIAGNOSIS — Z803 Family history of malignant neoplasm of breast: Secondary | ICD-10-CM | POA: Diagnosis not present

## 2018-12-04 DIAGNOSIS — Z1231 Encounter for screening mammogram for malignant neoplasm of breast: Secondary | ICD-10-CM | POA: Diagnosis not present

## 2018-12-14 DIAGNOSIS — E039 Hypothyroidism, unspecified: Secondary | ICD-10-CM | POA: Diagnosis not present

## 2018-12-14 DIAGNOSIS — N951 Menopausal and female climacteric states: Secondary | ICD-10-CM | POA: Diagnosis not present

## 2018-12-14 DIAGNOSIS — R5383 Other fatigue: Secondary | ICD-10-CM | POA: Diagnosis not present

## 2018-12-24 DIAGNOSIS — N959 Unspecified menopausal and perimenopausal disorder: Secondary | ICD-10-CM | POA: Diagnosis not present

## 2018-12-24 DIAGNOSIS — E569 Vitamin deficiency, unspecified: Secondary | ICD-10-CM | POA: Diagnosis not present

## 2018-12-24 DIAGNOSIS — E039 Hypothyroidism, unspecified: Secondary | ICD-10-CM | POA: Diagnosis not present

## 2018-12-24 DIAGNOSIS — R5383 Other fatigue: Secondary | ICD-10-CM | POA: Diagnosis not present

## 2019-05-09 IMAGING — DX DG SHOULDER 2+V*R*
3 series · 3 of 3 positions shown · non-contrast
Comparison: None.

CLINICAL DATA: 56-year-old female with chronic pain. Decreased
range of motion for 2 months.

EXAM:
RIGHT SHOULDER - 2+ VIEW

[grashey]
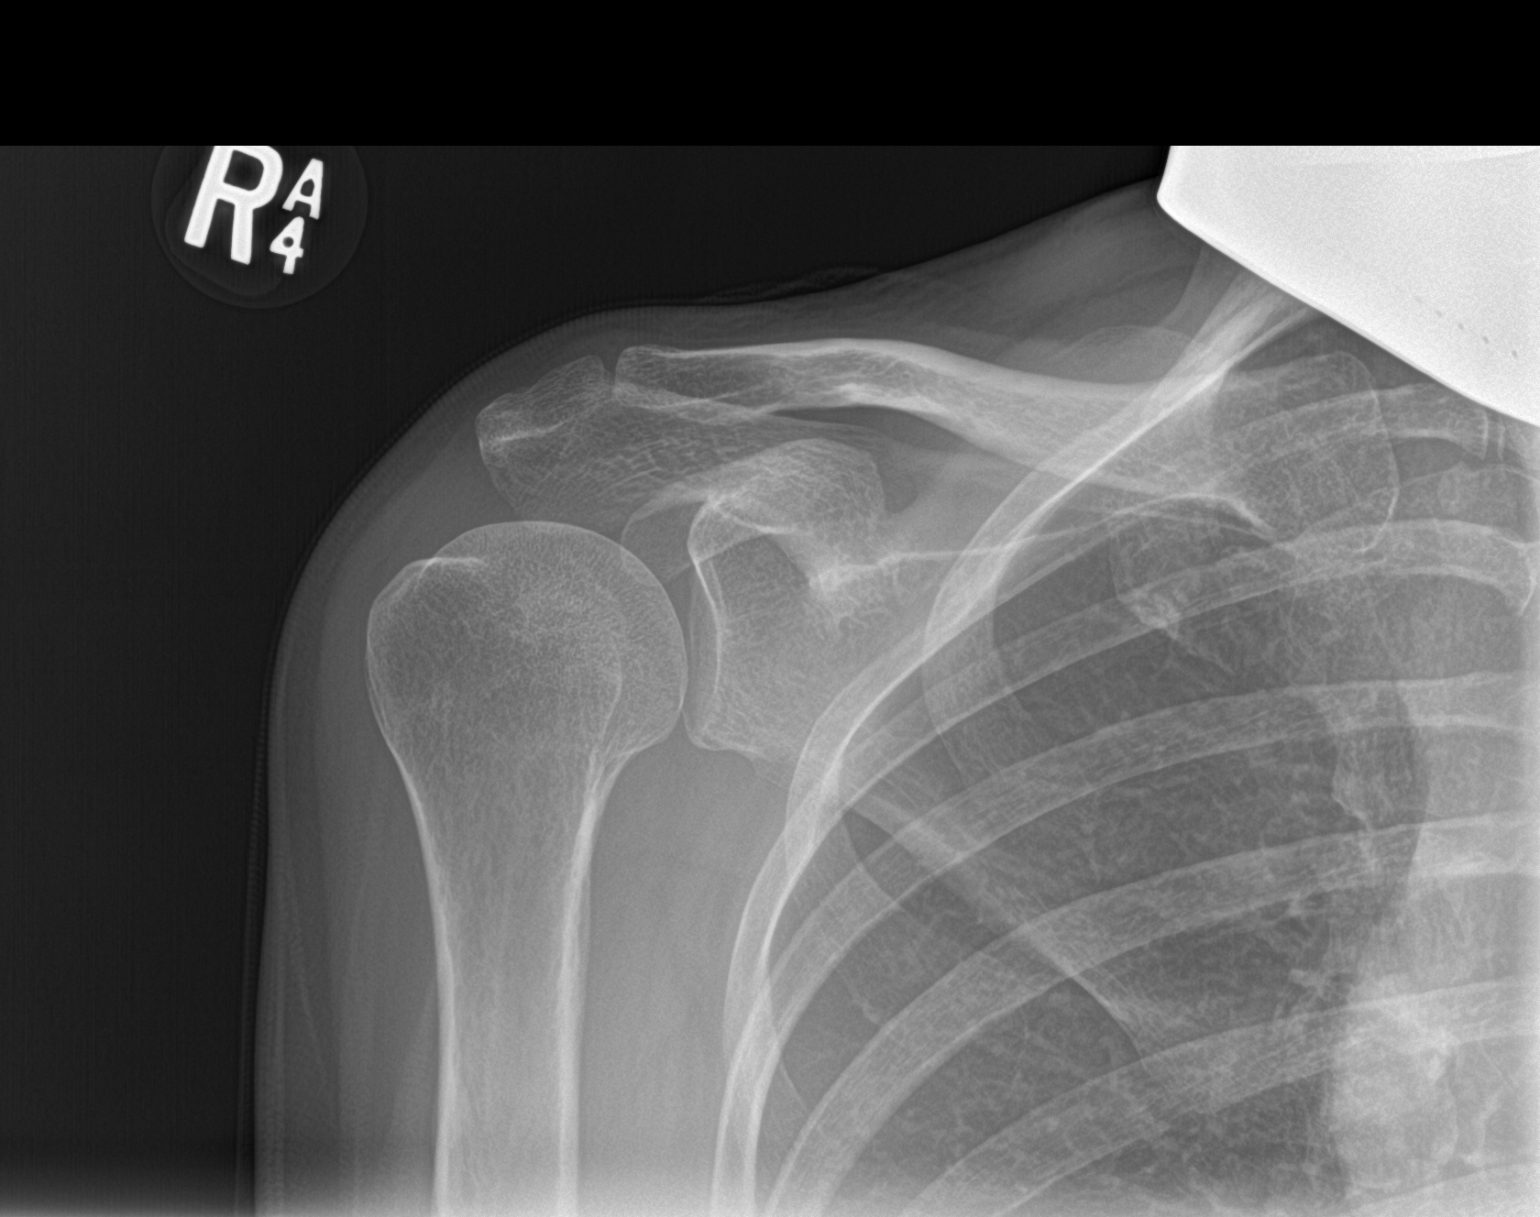

[y view]
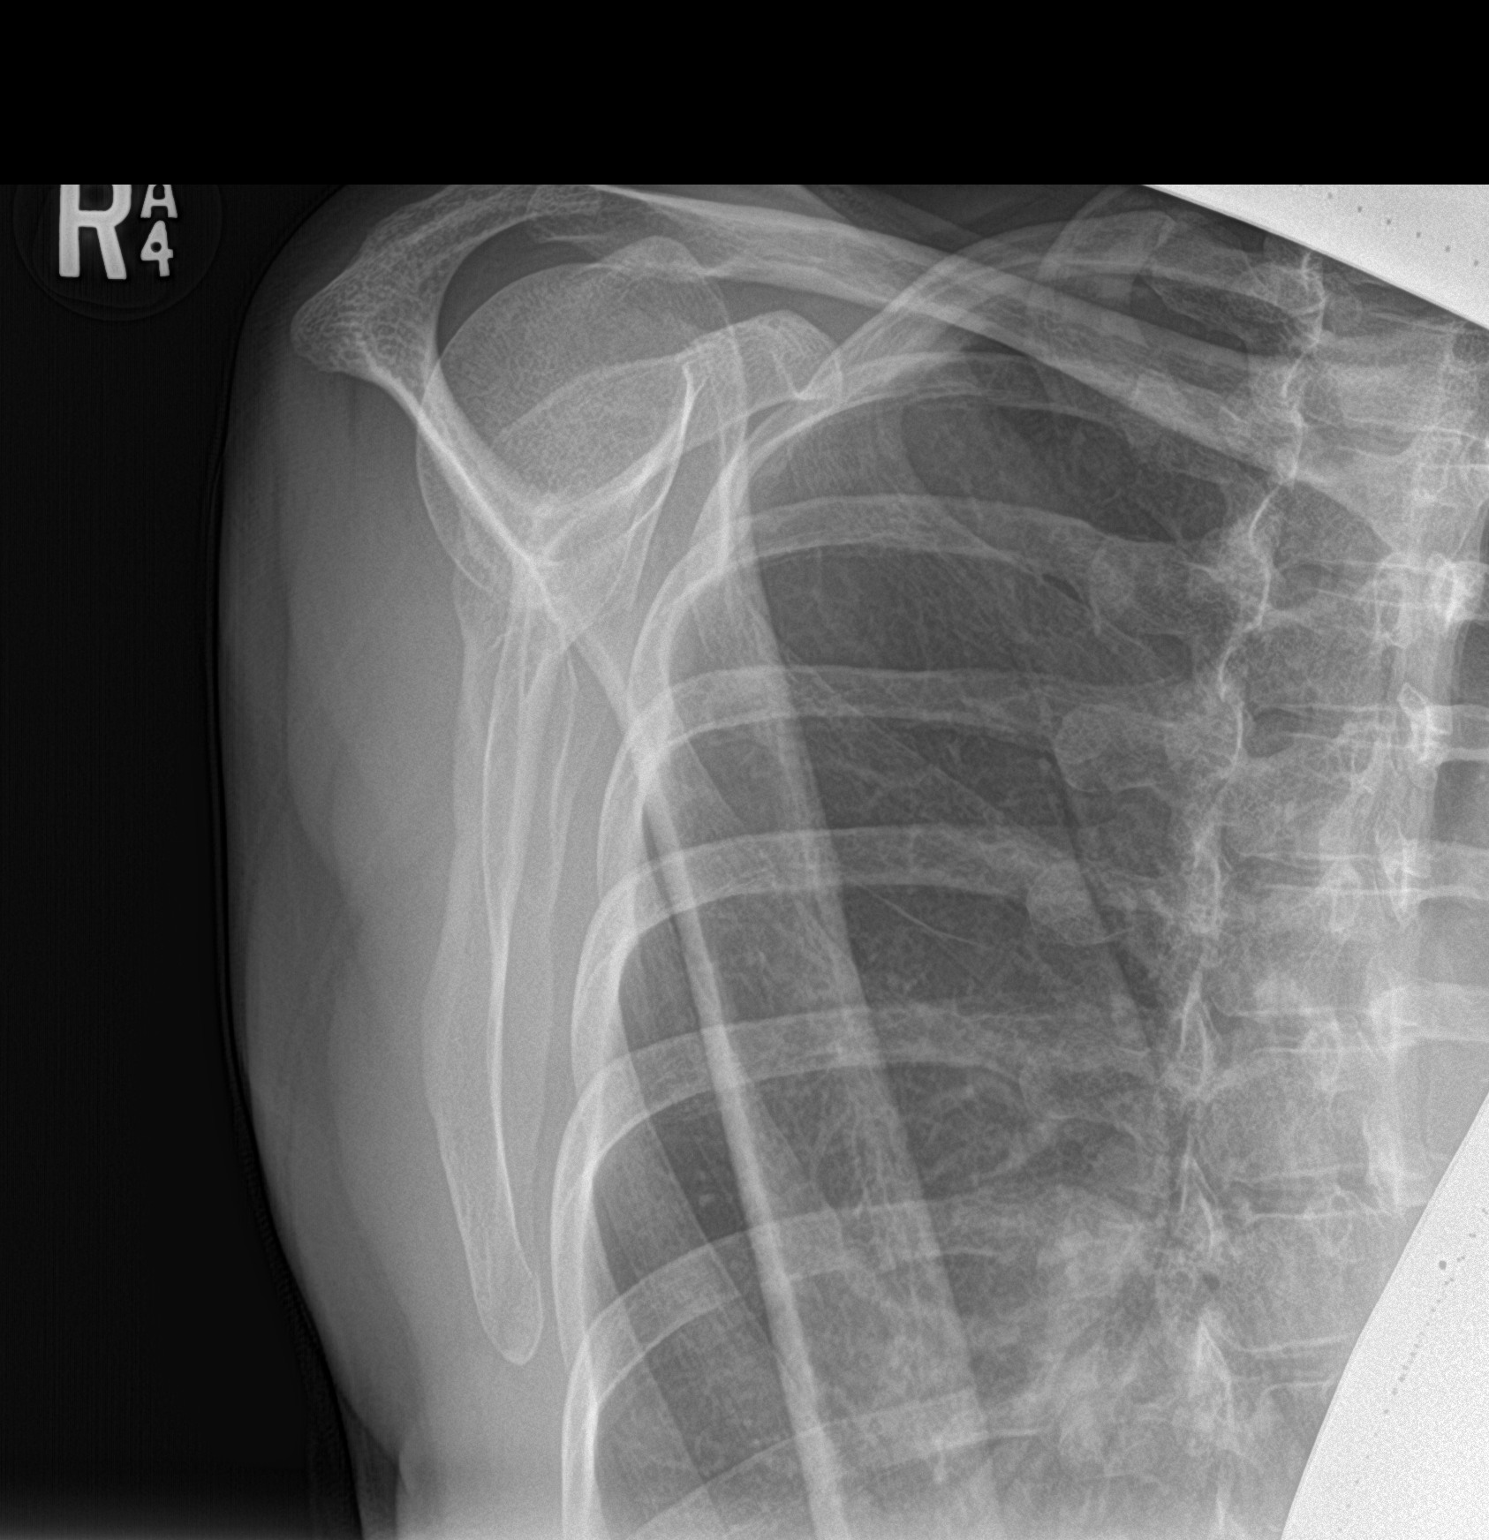

[shoulder axial]
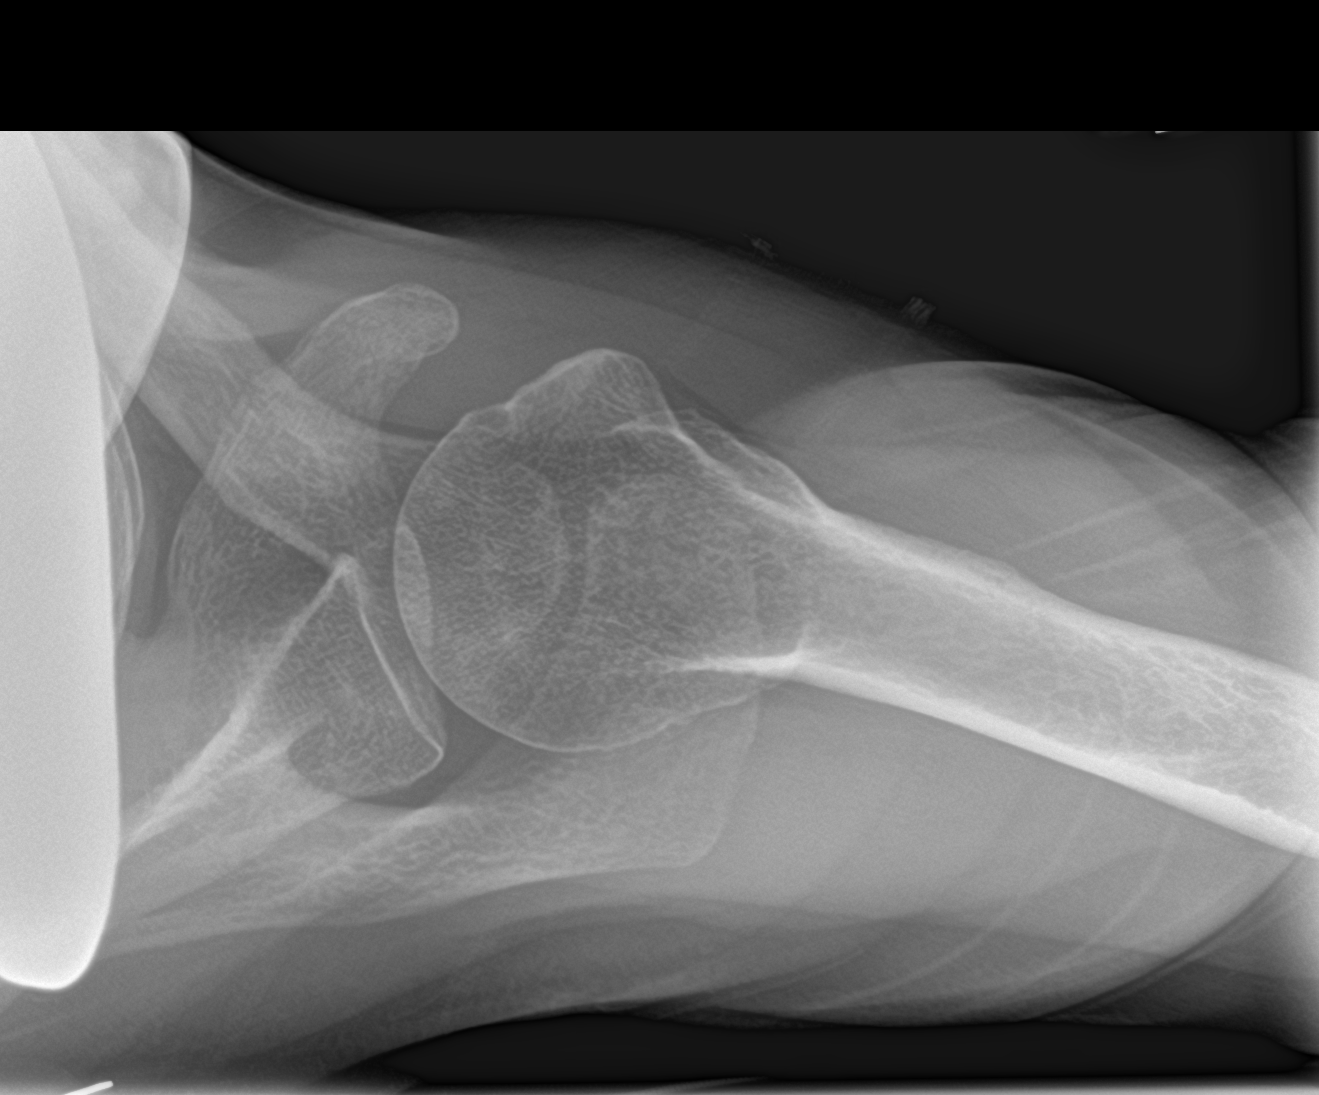

[3 of 3 positions shown; findings below may reference images not displayed]

FINDINGS: Incidental thyroid shield. Bone mineralization is within normal
limits. There is no evidence of fracture or dislocation. There is no
evidence of arthropathy or other focal bone abnormality. Soft
tissues are unremarkable. Negative visible right chest.
IMPRESSION: Negative.

## 2019-10-05 ENCOUNTER — Ambulatory Visit (INDEPENDENT_AMBULATORY_CARE_PROVIDER_SITE_OTHER): Payer: No Typology Code available for payment source | Admitting: Family Medicine

## 2019-10-05 ENCOUNTER — Other Ambulatory Visit: Payer: Self-pay

## 2019-10-05 ENCOUNTER — Encounter: Payer: Self-pay | Admitting: Family Medicine

## 2019-10-05 VITALS — BP 106/72 | HR 85 | Ht 63.0 in | Wt 126.0 lb

## 2019-10-05 DIAGNOSIS — M999 Biomechanical lesion, unspecified: Secondary | ICD-10-CM

## 2019-10-05 DIAGNOSIS — M47892 Other spondylosis, cervical region: Secondary | ICD-10-CM | POA: Diagnosis not present

## 2019-10-05 NOTE — Assessment & Plan Note (Signed)
Moderate to severe with worsening symptoms at this point.  Chronic problem.  Discussed medication management including anti-inflammatories and over-the-counter medications.  Discussed posture and ergonomics, responded well to manipulation.  Follow-up again in 4 to 8 weeks

## 2019-10-05 NOTE — Patient Instructions (Signed)
COOP pillow See me in 5-6 weeks You are famous!

## 2019-10-05 NOTE — Assessment & Plan Note (Signed)
   Decision today to treat with OMT was based on Physical Exam  After verbal consent patient was treated with HVLA, ME, FPR techniques in cervical, thoracic, rib,areas, all areas are chronic   Patient tolerated the procedure well with improvement in symptoms  Patient given exercises, stretches and lifestyle modifications  See medications in patient instructions if given  Patient will follow up in 4-8 weeks 

## 2019-10-05 NOTE — Progress Notes (Signed)
[place] Phone: [phone] Subjective:    I'm seeing this patient by the request  of:  Briscoe Deutscher, DO  CC: Neck pain follow-up  [id number]  Anna Young is a 59 y.o. female coming in with complaint of back pain. Last seen on 09/2018 for OMT. Increase in headache frequency since last week. Feels neck is tight.  Known arthritic changes of the neck.  Starting to have increasing tightness as well.  Patient describes it as a dull, throbbing aching pain.  Patient states that it is starting to wake her up at night.  Finding it difficult to find a comfortable sleeping position.      Past Medical History:  Diagnosis Date  . Abnormal uterine bleeding (AUB)   . Cervical osteoarthritis    Neck  . Endometrial polyp   . Fibroids, intramural 2014  . Frozen shoulder, right   . Hypothyroidism    Originally Dx by Endocrinology in 2009  . Migraines   . PONV (postoperative nausea and vomiting)   . Urinary incontinence, stress   . Wears glasses    Reading   Past Surgical History:  Procedure Laterality Date  . BREAST ENHANCEMENT SURGERY    . COLONOSCOPY  04/2017  . DILATATION & CURETTAGE/HYSTEROSCOPY WITH MYOSURE N/A 02/23/2018   Procedure: DILATATION & CURETTAGE/HYSTEROSCOPY WITH MYOSURE;  Surgeon: Salvadore Dom, MD;  Location: [facility];  Service: Gynecology;  Laterality: N/A;  . FACIAL COSMETIC SURGERY    . NASAL SEPTUM SURGERY    . WISDOM TOOTH EXTRACTION     Social History   Socioeconomic History  . Marital status: Married    Spouse name: Not on file  . Number of children: 3  . Years of education: Not on file  . Highest education level: Not on file  Occupational History  . Occupation: Realtor  Tobacco Use  . Smoking status: Never Smoker  . Smokeless tobacco: Never Used  Substance and Sexual Activity  . Alcohol use: No    Alcohol/week: 0.0 standard drinks  . Drug  use: No  . Sexual activity: Yes    Partners: Male    Birth control/protection: Other-see comments    Comment: Husband - vasectomy  Other Topics Concern  . Not on file  Social History Narrative  . Not on file   Social Determinants of Health   Financial Resource Strain:   . Difficulty of Paying Living Expenses:   Food Insecurity:   . Worried About Charity fundraiser in the Last Year:   . Arboriculturist in the Last Year:   Transportation Needs:   . Film/video editor (Medical):   Marland Kitchen Lack of Transportation (Non-Medical):   Physical Activity:   . Days of Exercise per Week:   . Minutes of Exercise per Session:   Stress:   . Feeling of Stress :   Social Connections:   . Frequency of Communication with Friends and Family:   . Frequency of Social Gatherings with Friends and Family:   . Attends Religious Services:   . Active Member of Clubs or Organizations:   . Attends Archivist Meetings:   Marland Kitchen Marital Status:    Allergies  Allergen Reactions  . Gluten Meal Other (See Comments)    Abdominal pain  . Lactose Intolerance (Gi)    Family History  Problem Relation Age of Onset  . Colon polyps Father   . Heart disease  Father 85  . Stroke Father   . Diabetes Mother   . Stroke Maternal Grandfather   . Colon cancer Neg Hx     Current Outpatient Medications (Endocrine & Metabolic):  .  levothyroxine (SYNTHROID) 50 MCG tablet, TAKE 1 TABLET BY MOUTH EVERY DAY (Patient taking differently: Take 50 mcg by mouth daily before breakfast. ) .  thyroid (ARMOUR THYROID) 60 MG tablet, Take 60 mg by mouth daily before breakfast. .  thyroid (ARMOUR THYROID) 60 MG tablet, Take 1 tablet (60 mg total) by mouth daily before breakfast.    Current Outpatient Medications (Analgesics):  .  acetaminophen (TYLENOL) 325 MG tablet, Take 650 mg by mouth daily as needed for moderate pain.   Current Outpatient Medications (Other):  Marland Kitchen  Multiple Vitamin (MULTI-VITAMINS PO), Take by mouth  daily. .  Probiotic Product (PROBIOTIC PO), Take by mouth daily.   Reviewed prior external information including notes and imaging from  primary care provider As well as notes that were available from care everywhere and other healthcare systems.  Past medical history, social, surgical and family history all reviewed in electronic medical record.  No pertanent information unless stated regarding to the chief complaint.   Review of Systems:  No  visual changes, nausea, vomiting, diarrhea, constipation, dizziness, abdominal pain, skin rash, fevers, chills, night sweats, weight loss, swollen lymph nodes, body aches, joint swelling, chest pain, shortness of breath, mood changes. POSITIVE muscle aches, headache  Objective  Blood pressure 106/72, pulse 85, height 5\' 3"  (1.6 m), weight 126 lb (57.2 kg), SpO2 98 %.   General: No apparent distress alert and oriented x3 mood and affect normal, dressed appropriately.  HEENT: Pupils equal, extraocular movements intact  Respiratory: Patient's speak in full sentences and does not appear short of breath  Cardiovascular: No lower extremity edema, non tender, no erythema  Neuro: Cranial nerves II through XII are intact, neurovascularly intact in all extremities with 2+ DTRs and 2+ pulses.  Gait normal with good balance and coordination.  MSK:  Non tender with full range of motion and good stability and symmetric strength and tone of shoulders, elbows, wrist, hip, knee and ankles bilaterally.  Neck exam mild loss of lordosis.  Tightness noted in the paraspinal musculature of the cervical spine in the parascapular region left greater than right.  Osteopathic findings C2 flexed rotated and side bent right C6 flexed rotated and side bent left T4 extended rotated and side bent left inhaled rib T6 extended rotated and side bent right     Impression and Recommendations:     This case required medical decision making of moderate complexity. The above  documentation has been reviewed and is accurate and complete Anna Pulley, DO       Note: This dictation was prepared with Dragon dictation along with smaller phrase technology. Any transcriptional errors that result from this process are unintentional.

## 2019-11-11 ENCOUNTER — Ambulatory Visit: Payer: No Typology Code available for payment source | Admitting: Family Medicine

## 2019-12-20 NOTE — Progress Notes (Signed)
59 y.o. [id number] Married White or Caucasian Not Hispanic or Latino female here for annual exam.  Patient states that she has been drinking Aleo Vera Juice trying to have a BM. She thinks this is causing her lower abdominal pain.   She has long term issues with constipation. Fiber bloats her and doesn't help. Probiotics don't help. Recently has been trying aleo vera juice, minimal help and she is having pain. She has tried miralax and magnesium, nothing works long term.   She has been having abdominal pain off and on for years, all lower belly, seems related to taking meds to help her have a BM. She hasn't seen a GI MD for this. Dr Collene Mares is who she has seen for her colonoscopies.   No vaginal bleeding. Sexually active, no pain. She takes a supplement for hot flashes which helps.   She had a pellet done early this year, estrogen, testosterone. Didn't feel a difference, didn't repeat it. She was taking progesterone at night, stopped.     Patient's last menstrual period was 06/13/2018.          Sexually active: Yes.    The current method of family planning is post menopausal status.    Exercising: Yes.    Biking  Smoker:  no  Health Maintenance: Pap: 10/3/19WNL HPV Neg   03-02-15 WNL NEG HR HPV  History of abnormal Pap:  Yes years ago  MMG:  12/10/19 normal per patient report requested.  BMD:   no Colonoscopy:  11-29-11 polyp. 12/18 normal, f/u in 10 years.  TDaP: 05/28/17 Gardasil: NA   reports that she has never smoked. She has never used smokeless tobacco. She reports that she does not drink alcohol and does not use drugs. Rare ETOH. Husband with a h/o testicular cancer.  3 adult children, 3 grand daughter's. 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  Past Medical History:  Diagnosis Date  . Abnormal uterine bleeding (AUB)   . Cervical osteoarthritis    Neck  . Endometrial polyp   . Fibroids, intramural 2014  . Frozen shoulder, right   . Hypothyroidism    Originally Dx by  Endocrinology in 2009  . Migraines   . PONV (postoperative nausea and vomiting)   . Urinary incontinence, stress   . Wears glasses    Reading    Past Surgical History:  Procedure Laterality Date  . BREAST ENHANCEMENT SURGERY    . COLONOSCOPY  04/2017  . DILATATION & CURETTAGE/HYSTEROSCOPY WITH MYOSURE N/A 02/23/2018   Procedure: DILATATION & CURETTAGE/HYSTEROSCOPY WITH MYOSURE;  Surgeon: Salvadore Dom, MD;  Location[facility];  Service: Gynecology;  Laterality: N/A;  . FACIAL COSMETIC SURGERY    . NASAL SEPTUM SURGERY    . WISDOM TOOTH EXTRACTION      Current Outpatient Medications  Medication Sig Dispense Refill  . levothyroxine (SYNTHROID) 25 MCG tablet Take 25 mcg by mouth as directed. Every other night    . levothyroxine (SYNTHROID) 50 MCG tablet TAKE 1 TABLET BY MOUTH EVERY DAY (Patient taking differently: Take 50 mcg by mouth daily before breakfast. Taking every other night) 90 tablet 0  . Multiple Vitamin (MULTI-VITAMINS PO) Take by mouth daily.    . Probiotic Product (PROBIOTIC PO) Take by mouth daily.    Marland Kitchen thyroid (ARMOUR THYROID) 60 MG tablet Take 60 mg by mouth daily before breakfast.     No current facility-administered medications for this visit.  Armour 60 daily, Synthroid 25 mcg one  night, 50 mcg the other night  Family History  Problem Relation Age of Onset  . Colon polyps Father   . Heart disease Father 17  . Stroke Father   . Diabetes Mother   . Stroke Maternal Grandfather   . Colon cancer Neg Hx     Review of Systems  Gastrointestinal: Positive for abdominal pain.  All other systems reviewed and are negative.   Exam:   BP 104/62   Pulse 77   Ht 5\' 3"  (1.6 m)   Wt 121 lb (54.9 kg)   LMP 06/13/2018   SpO2 98%   BMI 21.43 kg/m   Weight change: @WEIGHTCHANGE @ Height:   Height: 5\' 3"  (160 cm)  Ht Readings from Last 3 Encounters:  12/21/19 5\' 3"  (1.6 m)  10/05/19 5\' 3"  (1.6 m)  10/08/18 5\' 3"  (1.6 m)    General  appearance: alert, cooperative and appears stated age Head: Normocephalic, without obvious abnormality, atraumatic Neck: no adenopathy, supple, symmetrical, trachea midline and thyroid normal to inspection and palpation Lungs: clear to auscultation bilaterally Cardiovascular: regular rate and rhythm Breasts: normal appearance, no masses or tenderness, bilateral implants Abdomen: soft, non-tender; non distended,  no masses,  no organomegaly Extremities: extremities normal, atraumatic, no cyanosis or edema Skin: Skin color, texture, turgor normal. No rashes or lesions Lymph nodes: Cervical, supraclavicular, and axillary nodes normal. No abnormal inguinal nodes palpated Neurologic: Grossly normal   Pelvic: External genitalia:  no lesions              Urethra:  normal appearing urethra with no masses, tenderness or lesions              Bartholins and Skenes: normal                 Vagina: normal appearing vagina with normal color and discharge, no lesions              Cervix: no lesions               Bimanual Exam:  Uterus:  normal size, contour, position, consistency, mobility, non-tender              Adnexa: no mass, fullness, tenderness               Rectovaginal: Confirms               Anus:  normal sphincter tone, no lesions  Royal Hawthorn chaperoned for the exam.  A:  Well Woman with normal exam  Severe long term constipation  Lower abdominal/pelvic pain, suspect from constipaiton    P:   Pap with hpv (patient desires)  Mammogram UTD  She will f/u with Dr Collene Mares for her constipation and abdominal/pelvic pain  If she continues to have pain, will have her return for an ultrasound  Screening labs and TFT's  Discussed breast self exam  Discussed calcium and vit D intake

## 2019-12-21 ENCOUNTER — Encounter: Payer: Self-pay | Admitting: Obstetrics and Gynecology

## 2019-12-21 ENCOUNTER — Ambulatory Visit: Payer: No Typology Code available for payment source | Admitting: Obstetrics and Gynecology

## 2019-12-21 ENCOUNTER — Other Ambulatory Visit: Payer: Self-pay

## 2019-12-21 ENCOUNTER — Other Ambulatory Visit (HOSPITAL_COMMUNITY)
Admission: RE | Admit: 2019-12-21 | Discharge: 2019-12-21 | Disposition: A | Discharge status: Home / Self Care | Payer: No Typology Code available for payment source | Source: Ambulatory Visit | Attending: Obstetrics and Gynecology | Admitting: Obstetrics and Gynecology

## 2019-12-21 VITALS — BP 104/62 | HR 77 | Ht 63.0 in | Wt 121.0 lb

## 2019-12-21 DIAGNOSIS — Z0000 Encounter for general adult medical examination without abnormal findings: Secondary | ICD-10-CM

## 2019-12-21 DIAGNOSIS — R109 Unspecified abdominal pain: Secondary | ICD-10-CM

## 2019-12-21 DIAGNOSIS — Z124 Encounter for screening for malignant neoplasm of cervix: Secondary | ICD-10-CM | POA: Insufficient documentation

## 2019-12-21 DIAGNOSIS — K5900 Constipation, unspecified: Secondary | ICD-10-CM

## 2019-12-21 DIAGNOSIS — E039 Hypothyroidism, unspecified: Secondary | ICD-10-CM | POA: Diagnosis not present

## 2019-12-21 DIAGNOSIS — Z01419 Encounter for gynecological examination (general) (routine) without abnormal findings: Secondary | ICD-10-CM | POA: Diagnosis not present

## 2019-12-21 DIAGNOSIS — R102 Pelvic and perineal pain: Secondary | ICD-10-CM

## 2019-12-21 NOTE — Patient Instructions (Signed)

## 2019-12-21 NOTE — Addendum Note (Signed)
Addended by: Dorothy Spark on: 12/21/2019 04:57 PM   Modules accepted: Orders

## 2019-12-22 LAB — COMPREHENSIVE METABOLIC PANEL
ALT: 9 IU/L (ref 0–32)
AST: 16 IU/L (ref 0–40)
Albumin/Globulin Ratio: 1.8 (ref 1.2–2.2)
Albumin: 4.6 g/dL (ref 3.8–4.9)
Alkaline Phosphatase: 68 IU/L (ref 48–121)
BUN/Creatinine Ratio: 27 — ABNORMAL HIGH (ref 9–23)
BUN: 18 mg/dL (ref 6–24)
Bilirubin Total: <0.2 mg/dL (ref 0.0–1.2)
CO2: 26 mmol/L (ref 20–29)
Calcium: 9.7 mg/dL (ref 8.7–10.2)
Chloride: 101 mmol/L (ref 96–106)
Creatinine, Ser: 0.67 mg/dL (ref 0.57–1.00)
GFR calc Af Amer: 113 mL/min/{1.73_m2} (ref >59)
GFR calc non Af Amer: 98 mL/min/{1.73_m2} (ref >59)
Globulin, Total: 2.5 g/dL (ref 1.5–4.5)
Glucose: 86 mg/dL (ref 65–99)
Potassium: 4.8 mmol/L (ref 3.5–5.2)
Sodium: 138 mmol/L (ref 134–144)
Total Protein: 7.1 g/dL (ref 6.0–8.5)

## 2019-12-22 LAB — CBC
Hematocrit: 41.3 % (ref 34.0–46.6)
Hemoglobin: 13.9 g/dL (ref 11.1–15.9)
MCH: 31.2 pg (ref 26.6–33.0)
MCHC: 33.7 g/dL (ref 31.5–35.7)
MCV: 93 fL (ref 79–97)
Platelets: 323 10*3/uL (ref 150–450)
RBC: 4.46 x10E6/uL (ref 3.77–5.28)
RDW: 12.4 % (ref 11.7–15.4)
WBC: 5.1 10*3/uL (ref 3.4–10.8)

## 2019-12-22 LAB — THYROID PANEL WITH TSH
Free Thyroxine Index: 2.4 (ref 1.2–4.9)
T3 Uptake Ratio: 29 % (ref 24–39)
T4, Total: 8.4 ug/dL (ref 4.5–12.0)
TSH: <0.005 u[IU]/mL — ABNORMAL LOW (ref 0.450–4.500)

## 2019-12-22 LAB — LIPID PANEL
Chol/HDL Ratio: 4.7 ratio — ABNORMAL HIGH (ref 0.0–4.4)
Cholesterol, Total: 192 mg/dL (ref 100–199)
HDL: 41 mg/dL (ref >39)
LDL Chol Calc (NIH): 119 mg/dL — ABNORMAL HIGH (ref 0–99)
Triglycerides: 178 mg/dL — ABNORMAL HIGH (ref 0–149)
VLDL Cholesterol Cal: 32 mg/dL (ref 5–40)

## 2019-12-23 LAB — CYTOLOGY - PAP
Comment: NEGATIVE
Diagnosis: NEGATIVE
High risk HPV: NEGATIVE

## 2020-01-13 ENCOUNTER — Other Ambulatory Visit: Payer: Self-pay | Admitting: *Deleted

## 2020-01-13 ENCOUNTER — Other Ambulatory Visit (INDEPENDENT_AMBULATORY_CARE_PROVIDER_SITE_OTHER): Payer: No Typology Code available for payment source

## 2020-01-13 ENCOUNTER — Other Ambulatory Visit: Payer: Self-pay

## 2020-01-13 DIAGNOSIS — E789 Disorder of lipoprotein metabolism, unspecified: Secondary | ICD-10-CM

## 2020-01-14 LAB — LIPID PANEL
Chol/HDL Ratio: 4.3 ratio (ref 0.0–4.4)
Cholesterol, Total: 183 mg/dL (ref 100–199)
HDL: 43 mg/dL (ref >39)
LDL Chol Calc (NIH): 119 mg/dL — ABNORMAL HIGH (ref 0–99)
Triglycerides: 116 mg/dL (ref 0–149)
VLDL Cholesterol Cal: 21 mg/dL (ref 5–40)

## 2020-08-01 ENCOUNTER — Other Ambulatory Visit (HOSPITAL_COMMUNITY)
Admission: RE | Admit: 2020-08-01 | Discharge: 2020-08-01 | Disposition: A | Discharge status: Home / Self Care | Payer: Self-pay | Source: Ambulatory Visit | Attending: Obstetrics and Gynecology | Admitting: Obstetrics and Gynecology

## 2020-08-01 ENCOUNTER — Encounter: Payer: Self-pay | Admitting: Obstetrics and Gynecology

## 2020-08-01 ENCOUNTER — Other Ambulatory Visit: Payer: Self-pay

## 2020-08-01 ENCOUNTER — Ambulatory Visit: Payer: BC Managed Care – PPO | Admitting: Obstetrics and Gynecology

## 2020-08-01 ENCOUNTER — Telehealth: Payer: Self-pay

## 2020-08-01 VITALS — BP 190/62 | HR 71 | Ht 63.0 in | Wt 119.4 lb

## 2020-08-01 DIAGNOSIS — N950 Postmenopausal bleeding: Secondary | ICD-10-CM | POA: Insufficient documentation

## 2020-08-01 DIAGNOSIS — R102 Pelvic and perineal pain: Secondary | ICD-10-CM | POA: Diagnosis not present

## 2020-08-01 DIAGNOSIS — R030 Elevated blood-pressure reading, without diagnosis of hypertension: Secondary | ICD-10-CM | POA: Diagnosis not present

## 2020-08-01 DIAGNOSIS — N882 Stricture and stenosis of cervix uteri: Secondary | ICD-10-CM | POA: Diagnosis not present

## 2020-08-01 NOTE — Telephone Encounter (Signed)
Call placed to patient about elevated blood pressure while in the office today. Advised patient to check it at home or call her pcp. Also offered patient nurse visit for follow up bp check.

## 2020-08-01 NOTE — Progress Notes (Signed)
GYNECOLOGY  VISIT   HPI: 60 y.o.   Married White or Caucasian Not Hispanic or Latino  female   [phone] with Patient's last menstrual period was 06/25/2018.   here for post menopausal cramping . Patient states that she got hormone replacement at [facility]. She states that she got estrogen, progesterone and testosterone. She states that she then had 3 periods in November and December and a constant cramping in her lower abdomen for a couple weeks but they have now subsided.   She was taking testosterone (cream and then pellet) on and off for about 3 year. Last pellet was at the end of the October. 2021. Felt better on it, she had more energy.  She was then given a low dose of estrogen ~1.5 years ago, she did that every 4 months for 2 doses. Then did 9 months without anything and felt terrible. Then she had a higher dose of estrogen pellet on October 6 (had estrogen and testosterone in it), she took progesterone orally at night. No effects for the first month after getting the pellet. Then on the first of November, she felt like she had a surge of hormones. Breasts were tender, she had 3 "cycles" in 2 months. Each time she bleed for a week, moderate flow, no cramps. She wanted them to take out the pellet, but they couldn't. Symptoms lasted for 2 months. Last episode of bleeding was either the end of December or the first of January.   She had menstrual cramping last week, felt premenstrual but no cycle. Now she feels fine.   No hot flashes, night sweats or vaginal dryness.   Last pap was on 12/21/19: WNL, negative hpv  GYNECOLOGIC HISTORY: Patient's last menstrual period was 06/25/2018. Contraception:PMP Menopausal hormone therapy: none        OB History    Gravida  3   Para  3   Term  3   Preterm  0   AB  0   Living  3     SAB  0   IAB  0   Ectopic  0   Multiple  0   Live Births  3              Patient Active Problem List   Diagnosis Date Noted  . Hypothyroidism  12/21/2019  . Hypothyroid   . Frozen shoulder 04/10/2017  . Migraine 11/01/2015  . DJD (degenerative joint disease), cervical 10/06/2015  . Nonallopathic lesion of cervical region 10/06/2015  . Nonallopathic lesion of thoracic region 10/06/2015  . Nonallopathic lesion of lumbosacral region 10/06/2015  . Constipation 02/10/2013  . Fibroids 02/10/2013  . Urinary incontinence 02/10/2013    Past Medical History:  Diagnosis Date  . Abnormal uterine bleeding (AUB)   . Cervical osteoarthritis    Neck  . Endometrial polyp   . Fibroids, intramural 2014  . Frozen shoulder, right   . Hypothyroidism    Originally Dx by Endocrinology in 2009  . Migraines   . PONV (postoperative nausea and vomiting)   . Urinary incontinence, stress   . Wears glasses    Reading    Past Surgical History:  Procedure Laterality Date  . BREAST ENHANCEMENT SURGERY    . COLONOSCOPY  04/2017  . DILATATION & CURETTAGE/HYSTEROSCOPY WITH MYOSURE N/A 02/23/2018   Procedure: DILATATION & CURETTAGE/HYSTEROSCOPY WITH MYOSURE;  Surgeon: Salvadore Dom, MD;  Location: [facility];  Service: Gynecology;  Laterality: N/A;  . FACIAL COSMETIC SURGERY    .  NASAL SEPTUM SURGERY    . WISDOM TOOTH EXTRACTION      Current Outpatient Medications  Medication Sig Dispense Refill  . levothyroxine (SYNTHROID) 25 MCG tablet Take 25 mcg by mouth as directed. Every other night    . levothyroxine (SYNTHROID) 50 MCG tablet TAKE 1 TABLET BY MOUTH EVERY DAY (Patient taking differently: Take 50 mcg by mouth daily before breakfast. Taking every other night) 90 tablet 0  . Multiple Vitamin (MULTI-VITAMINS PO) Take by mouth daily.    . Probiotic Product (PROBIOTIC PO) Take by mouth daily.    Marland Kitchen thyroid (ARMOUR) 60 MG tablet Take 60 mg by mouth daily before breakfast.     No current facility-administered medications for this visit.     ALLERGIES: Gluten meal and Lactose intolerance (gi)  Family History  Problem  Relation Age of Onset  . Colon polyps Father   . Heart disease Father 30  . Stroke Father   . Diabetes Mother   . Stroke Maternal Grandfather   . Colon cancer Neg Hx     Social History   Socioeconomic History  . Marital status: Married    Spouse name: Not on file  . Number of children: 3  . Years of education: Not on file  . Highest education level: Not on file  Occupational History  . Occupation: Realtor  Tobacco Use  . Smoking status: Never Smoker  . Smokeless tobacco: Never Used  Vaping Use  . Vaping Use: Never used  Substance and Sexual Activity  . Alcohol use: No    Alcohol/week: 0.0 standard drinks  . Drug use: No  . Sexual activity: Yes    Partners: Male    Birth control/protection: Other-see comments    Comment: Husband - vasectomy  Other Topics Concern  . Not on file  Social History Narrative  . Not on file   Social Determinants of Health   Financial Resource Strain: Not on file  Food Insecurity: Not on file  Transportation Needs: Not on file  Physical Activity: Not on file  Stress: Not on file  Social Connections: Not on file  Intimate Partner Violence: Not on file    Review of Systems  All other systems reviewed and are negative.   PHYSICAL EXAMINATION:    BP (!) 190/62   Pulse 71   Ht 5\' 3"  (1.6 m)   Wt 119 lb 6.4 oz (54.2 kg)   LMP 06/25/2018   SpO2 98%   BMI 21.15 kg/m     General appearance: alert, cooperative and appears stated age   Pelvic: External genitalia:  no lesions              Urethra:  normal appearing urethra with no masses, tenderness or lesions              Bartholins and Skenes: normal                 Vagina: normal appearing vagina with normal color and discharge, no lesions              Cervix: no lesions and stenotic              Bimanual Exam:  Uterus:  normal size, contour, position, consistency, mobility, non-tender and anteverted              Adnexa: no mass, fullness, tenderness               The risks of  endometrial biopsy were reviewed and  a consent was obtained.  A speculum was placed in the vagina and the cervix was cleansed with betadine. A tenaculum was placed on the cervix, I was unable to pass the pipelle into the uterine cavity. The cervix needed to be dilated. The cervix was dilated with the mini-dilators up to the #3 hagar. Then the pipelle was then placed into the endometrial cavity. The uterus sounded to 7 cm. The endometrial biopsy was performed, taking care to get a representative sample, sampling 360 degrees of the uterine cavity. A small to moderate amount of tissue was obtained. The tenaculum and speculum were removed. There were no complications.    Chaperone was present for exam.  1. Postmenopausal bleeding Suspect from her hormone use at the end of last year. She is off of all hormones.  - Surgical pathology( [facility]/ [facility])  2. Pelvic cramping Normal exam, no current symptoms. If pain is persistent she will let me know and I will set up an ultraound  3. Cervical stenosis (uterine cervix) Needed to dilate her cervix  4. Elevated BP without diagnosis of hypertension Noticed her BP after she left the office. CMA called the patient and left a message about the importance of repeating her BP (her, at home or with primary)  In addition to the endometrial biopsy, over 20 minutes was spent in total patient care.

## 2020-08-01 NOTE — Patient Instructions (Signed)

## 2020-08-03 LAB — SURGICAL PATHOLOGY

## 2020-08-21 ENCOUNTER — Telehealth: Payer: Self-pay | Admitting: Obstetrics and Gynecology

## 2020-08-21 NOTE — Telephone Encounter (Signed)
-----   Message from Burnice Logan, RN sent at 08/04/2020  1:58 PM EDT ----- Regarding: Path Report Dr. Talbert Nan,   I spoke with Dr. Melina Copa who read the path report.  She said she can not say one way or another if there are hormonal changes or showed atrophic changes. She said it is begin and possible proliferative pattern.  She said you can call her cell at [phone] to further discuss, if needed.   Thanks

## 2020-08-21 NOTE — Telephone Encounter (Signed)
Please let the patient know that the pathologist couldn't determine if there was hormonal stimulation. I would recommend provera 5 mg x 5 days just to make sure the lining is empty. Since she is no longer on estrogen one time with the provera should be enough.

## 2020-08-21 NOTE — Telephone Encounter (Signed)
Left message to call Paulina Muchmore, RN at [facility], [phone].  

## 2020-08-22 MED ORDER — MEDROXYPROGESTERONE ACETATE 5 MG PO TABS: 5.0000 mg | ORAL_TABLET | Freq: Every day | ORAL | 0 refills | Status: DC

## 2020-08-22 NOTE — Telephone Encounter (Signed)
Spoke with patient, advised as seen below per Dr. Talbert Nan.  Rx to verified pharmacy.  Patient verbalizes understanding and is agreeable.  Encounter closed.

## 2020-12-20 NOTE — Progress Notes (Signed)
60 y.o. G6P3003 Married White or Caucasian Not Hispanic or Latino female here for annual exam.   She was evaluated for PMP bleeding earlier this year (had been on hormones from the naturopath). Endometrial biopsy was benign, pathologist was unsure about hormonal stimulation. She was given provera for 5 days, she had minimal spotting.  No dyspareunia.  No bowel or bladder c/o. On medication for IBS-C, having daily BM's.     Patient's last menstrual period was 06/25/2018.          Sexually active: Yes.    The current method of family planning is status postmenopausal    Exercising: No.  The patient does not participate in regular exercise at present. Smoker:  no  Health Maintenance: Pap: 12/21/19  WNL Hr HPV neg, 10/3/19WNL HPV Neg   03-02-15 WNL NEG HR HPV  History of abnormal Pap:  yes many years ago  MMG: 12/11/20 density C, BI-RADS Cat 2: Benign BMD:   none  Colonoscopy: 12/19/20 f/u 5 years  TDaP:  05/28/17 Gardasil: n/a   reports that she has never smoked. She has never used smokeless tobacco. She reports that she does not drink alcohol and does not use drugs. 3 adult children, 3 grand daughter's. 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   Past Medical History:  Diagnosis Date   Abnormal uterine bleeding (AUB)    Cervical osteoarthritis    Neck   Endometrial polyp    Fibroids, intramural 2014   Frozen shoulder, right    Hypothyroidism    Originally Dx by Endocrinology in 2009   Migraines    PONV (postoperative nausea and vomiting)    Urinary incontinence, stress    Wears glasses    Reading    Past Surgical History:  Procedure Laterality Date   BREAST ENHANCEMENT SURGERY     COLONOSCOPY  04/2017   DILATATION & CURETTAGE/HYSTEROSCOPY WITH MYOSURE N/A 02/23/2018   Procedure: DILATATION & CURETTAGE/HYSTEROSCOPY WITH MYOSURE;  Surgeon: Salvadore Dom, MD;  Location: [facility];  Service: Gynecology;  Laterality: N/A;   FACIAL  COSMETIC SURGERY     NASAL SEPTUM SURGERY     WISDOM TOOTH EXTRACTION      Current Outpatient Medications  Medication Sig Dispense Refill   levothyroxine (SYNTHROID) 25 MCG tablet Take 25 mcg by mouth as directed. Every other night     levothyroxine (SYNTHROID) 50 MCG tablet TAKE 1 TABLET BY MOUTH EVERY DAY (Patient taking differently: Take 50 mcg by mouth daily before breakfast. Taking every other night) 90 tablet 0   MOTEGRITY 2 MG TABS Take 1 tablet by mouth daily.     Multiple Vitamin (MULTI-VITAMINS PO) Take by mouth daily.     thyroid (ARMOUR) 60 MG tablet Take 60 mg by mouth daily before breakfast.     No current facility-administered medications for this visit.  G[facility] her medication from [facility].   Family History  Problem Relation Age of Onset   Colon polyps Father    Heart disease Father 30   Stroke Father    Diabetes Mother    Stroke Maternal Grandfather    Colon cancer Neg Hx     Review of Systems  All other systems reviewed and are negative.  Exam:   BP 118/70   Pulse 73   Ht '5\' 3"'$  (1.6 m)   Wt 119 lb 3.2 oz (54.1 kg)   LMP 06/25/2018   SpO2 97%   BMI 21.12  kg/m   Weight change: '@WEIGHTCHANGE'$ @ Height:   Height: '5\' 3"'$  (160 cm)  Ht Readings from Last 3 Encounters:  12/21/20 '5\' 3"'$  (1.6 m)  08/01/20 '5\' 3"'$  (1.6 m)  12/21/19 '5\' 3"'$  (1.6 m)    General appearance: alert, cooperative and appears stated age Head: Normocephalic, without obvious abnormality, atraumatic Neck: no adenopathy, supple, symmetrical, trachea midline and thyroid normal to inspection and palpation Lungs: clear to auscultation bilaterally Cardiovascular: regular rate and rhythm Breasts: normal appearance, no masses or tenderness Abdomen: soft, non-tender; non distended,  no masses,  no organomegaly Extremities: extremities normal, atraumatic, no cyanosis or edema Skin: Skin color, texture, turgor normal. No rashes or lesions Lymph nodes: Cervical, supraclavicular, and axillary nodes  normal. No abnormal inguinal nodes palpated Neurologic: Grossly normal   Pelvic: External genitalia:  no lesions              Urethra:  normal appearing urethra with no masses, tenderness or lesions              Bartholins and Skenes: normal                 Vagina: normal appearing vagina with normal color and discharge, no lesions              Cervix: no lesions               Bimanual Exam:  Uterus:  normal size, contour, position, consistency, mobility, non-tender              Adnexa: no mass, fullness, tenderness               Rectovaginal: Confirms               Anus:  normal sphincter tone, no lesions  Gae Dry chaperoned for the exam.  1. Well woman exam Discussed breast self exam Discussed calcium and vit D intake Mammogram and colonoscopy just done No pap this year  2. History of postmenopausal bleeding Negative biopsy in 3/22 Will treat with one more round of provera, call with any bleeding  3. Hypothyroidism, unspecified type Managed at [facility]  - Thyroid Panel With TSH  4. Laboratory exam ordered as part of routine general medical examination - CBC - Comprehensive metabolic panel - Lipid panel

## 2020-12-21 ENCOUNTER — Ambulatory Visit (INDEPENDENT_AMBULATORY_CARE_PROVIDER_SITE_OTHER): Payer: BC Managed Care – PPO | Admitting: Obstetrics and Gynecology

## 2020-12-21 ENCOUNTER — Encounter: Payer: Self-pay | Admitting: Obstetrics and Gynecology

## 2020-12-21 ENCOUNTER — Other Ambulatory Visit: Payer: Self-pay

## 2020-12-21 VITALS — BP 118/70 | HR 73 | Ht 63.0 in | Wt 119.2 lb

## 2020-12-21 DIAGNOSIS — E039 Hypothyroidism, unspecified: Secondary | ICD-10-CM | POA: Diagnosis not present

## 2020-12-21 DIAGNOSIS — Z01419 Encounter for gynecological examination (general) (routine) without abnormal findings: Secondary | ICD-10-CM

## 2020-12-21 DIAGNOSIS — Z8742 Personal history of other diseases of the female genital tract: Secondary | ICD-10-CM | POA: Diagnosis not present

## 2020-12-21 DIAGNOSIS — Z0000 Encounter for general adult medical examination without abnormal findings: Secondary | ICD-10-CM | POA: Diagnosis not present

## 2020-12-21 MED ORDER — MEDROXYPROGESTERONE ACETATE 5 MG PO TABS: 5.0000 mg | ORAL_TABLET | Freq: Every day | ORAL | 0 refills | Status: DC

## 2020-12-21 NOTE — Patient Instructions (Signed)

## 2021-03-05 ENCOUNTER — Telehealth: Payer: Self-pay

## 2021-03-05 NOTE — Telephone Encounter (Signed)
Caller states she is having heart fluttering. Translation No Nurse Assessment Nurse: Lenon Curt, RN, Melanie Date/Time (Eastern Time): 03/05/2021 11:56:47 AM Confirm and document reason for call. If symptomatic, describe symptoms. ---Caller states she is having heart fluttering. Last night left shoulder blade sharp pain and gone, woke up heart harder/faster than normal. Pain in left neck to chest over a week ago. 92 radial pulse RR. Menopause herbal products more regularly for a month. Pain midsternal with deep breath. Does the patient have any new or worsening symptoms? ---Yes Will a triage be completed? ---Yes Related visit to physician within the last 2 weeks? ---No Does the PT have any chronic conditions? (i.e. diabetes, asthma, this includes High risk factors for pregnancy, etc.) ---Yes List chronic conditions. ---Thyroid, metegrity, hormones, VITs Is this a behavioral health or substance abuse call? ---No Guidelines Guideline Title Affirmed Question Affirmed Notes Nurse Date/Time (Eastern Time) Heart Rate and Heartbeat Questions New or worsened shortness of breath with activity (dyspnea on exertion) Lenon Curt, RN, Melanie 03/05/2021 12:02:42 PM PLEASE NOTE: All timestamps contained within this report are represented as [place] Standard Time. CONFIDENTIALTY NOTICE: This fax transmission is intended only for the addressee. It contains information that is legally privileged, confidential or otherwise protected from use or disclosure. If you are not the intended recipient, you are strictly prohibited from reviewing, disclosing, copying using or disseminating any of this information or taking any action in reliance on or regarding this information. If you have received this fax in error, please notify us immediately by telephone so that we can arrange for its return to [place]. Phone: [phone], Toll-Free: [phone], Fax: [phone] Page: 2 of 2 Call Id: [id number] [place]. Time  Eilene Ghazi Time) Disposition Final User 03/05/2021 12:06:31 PM Go to ED Now (or PCP triage) Yes Lenon Curt, RN, Donnajean Lopes Disagree/Comply Disagree Caller Understands Yes PreDisposition Call Doctor Care Advice Given Per Guideline GO TO ED NOW (OR PCP TRIAGE): BRING MEDICINES: CARE ADVICE given per Heart Rate and Heartbeat Questions (Adult) guideline. * IF NO PCP (PRIMARY CARE PROVIDER) SECOND-LEVEL TRIAGE: You need to be seen within the next hour. Go to the [facility] at _____________ [place] as soon as you can. Comments User: Erik Obey, RN Date/Time Eilene Ghazi Time): 03/05/2021 12:09:28 PM Transferred to main line to hold for next person available. Referrals GO TO FACILITY REFUSED

## 2021-03-06 ENCOUNTER — Ambulatory Visit (INDEPENDENT_AMBULATORY_CARE_PROVIDER_SITE_OTHER)
Admission: RE | Admit: 2021-03-06 | Discharge: 2021-03-06 | Disposition: A | Discharge status: Home / Self Care | Payer: BC Managed Care – PPO | Source: Ambulatory Visit | Attending: Physician Assistant | Admitting: Physician Assistant

## 2021-03-06 ENCOUNTER — Encounter: Payer: Self-pay | Admitting: Physician Assistant

## 2021-03-06 ENCOUNTER — Ambulatory Visit: Payer: BC Managed Care – PPO | Admitting: Physician Assistant

## 2021-03-06 ENCOUNTER — Other Ambulatory Visit: Payer: Self-pay

## 2021-03-06 VITALS — BP 109/69 | HR 72 | Temp 97.0°F | Ht 63.0 in | Wt 119.0 lb

## 2021-03-06 DIAGNOSIS — R0789 Other chest pain: Secondary | ICD-10-CM

## 2021-03-06 DIAGNOSIS — Z79890 Hormone replacement therapy (postmenopausal): Secondary | ICD-10-CM

## 2021-03-06 DIAGNOSIS — E038 Other specified hypothyroidism: Secondary | ICD-10-CM

## 2021-03-06 DIAGNOSIS — R0602 Shortness of breath: Secondary | ICD-10-CM

## 2021-03-06 LAB — COMPREHENSIVE METABOLIC PANEL
ALT: 6 U/L (ref 0–35)
AST: 14 U/L (ref 0–37)
Albumin: 4.5 g/dL (ref 3.5–5.2)
Alkaline Phosphatase: 65 U/L (ref 39–117)
BUN: 14 mg/dL (ref 6–23)
CO2: 29 mEq/L (ref 19–32)
Calcium: 10.1 mg/dL (ref 8.4–10.5)
Chloride: 104 mEq/L (ref 96–112)
Creatinine, Ser: 0.72 mg/dL (ref 0.40–1.20)
GFR: 91.81 mL/min (ref >60.00)
Glucose, Bld: 99 mg/dL (ref 70–99)
Potassium: 5 mEq/L (ref 3.5–5.1)
Sodium: 141 mEq/L (ref 135–145)
Total Bilirubin: 0.2 mg/dL (ref 0.2–1.2)
Total Protein: 7.1 g/dL (ref 6.0–8.3)

## 2021-03-06 LAB — CBC WITH DIFFERENTIAL/PLATELET
Basophils Absolute: 0 10*3/uL (ref 0.0–0.1)
Basophils Relative: 0.4 % (ref 0.0–3.0)
Eosinophils Absolute: 0.1 10*3/uL (ref 0.0–0.7)
Eosinophils Relative: 1.3 % (ref 0.0–5.0)
HCT: 40.2 % (ref 36.0–46.0)
Hemoglobin: 13 g/dL (ref 12.0–15.0)
Lymphocytes Relative: 24.8 % (ref 12.0–46.0)
Lymphs Abs: 1.4 10*3/uL (ref 0.7–4.0)
MCHC: 32.4 g/dL (ref 30.0–36.0)
MCV: 90.6 fl (ref 78.0–100.0)
Monocytes Absolute: 0.4 10*3/uL (ref 0.1–1.0)
Monocytes Relative: 7.9 % (ref 3.0–12.0)
Neutro Abs: 3.6 10*3/uL (ref 1.4–7.7)
Neutrophils Relative %: 65.6 % (ref 43.0–77.0)
Platelets: 416 10*3/uL — ABNORMAL HIGH (ref 150.0–400.0)
RBC: 4.44 Mil/uL (ref 3.87–5.11)
RDW: 13 % (ref 11.5–15.5)
WBC: 5.5 10*3/uL (ref 4.0–10.5)

## 2021-03-06 LAB — BRAIN NATRIURETIC PEPTIDE: Pro B Natriuretic peptide (BNP): 40 pg/mL (ref 0.0–100.0)

## 2021-03-06 LAB — D-DIMER, QUANTITATIVE: D-Dimer, Quant: 0.3 mcg/mL FEU (ref <0.50)

## 2021-03-06 LAB — THYROID PANEL WITH TSH
Free Thyroxine Index: 3.3 (ref 1.4–3.8)
T3 Uptake: 30 % (ref 22–35)
T4, Total: 11.1 ug/dL (ref 5.1–11.9)
TSH: <0.01 mIU/L — ABNORMAL LOW (ref 0.40–4.50)

## 2021-03-06 LAB — TROPONIN I (HIGH SENSITIVITY): High Sens Troponin I: 5 ng/L (ref 2–17)

## 2021-03-06 NOTE — Patient Instructions (Signed)
Good to meet you today! Please go to the lab for blood work and I will call with results. Also please have a chest XRAY done at [facility]   Referral sent to cardiology. Your EKG looks normal right now.   Low threshold to go to the ED if any sudden worsening symptoms.

## 2021-03-06 NOTE — Progress Notes (Signed)
Subjective:    Patient ID: Anna Young, female    DOB: Jun 28, 1960, 60 y.o.   MRN: [id number]  Chief Complaint  Patient presents with   Palpitations    HPI Patient is in today for palpitations.  A few years ago had a similar situation.  One week ago, thought she had an infection in her throat and then it "moved to her chest."  Sunday night had pain in left shoulder blade and felt heart racing Monday morning.   No supplements in the last 2 days. Sees [facility] for progesterone, testosterone cream, estradiol patch, and thyroid management.  Feels good right now. Did have some heart racing this morning.   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   No nausea, no headache, blurred vision, weakness.  No hx of acid reflux.  No hx of smoking. Rare alcohol. Father had quadruple bypass at 77. Mother's side of the family - aunts have pacemakers present.   No issues with HTN or hyperlipidemia per patient.   Past Medical History:  Diagnosis Date   Abnormal uterine bleeding (AUB)    Cervical osteoarthritis    Neck   Endometrial polyp    Fibroids, intramural 2014   Frozen shoulder, right    Hypothyroidism    Originally Dx by Endocrinology in 2009   Migraines    PONV (postoperative nausea and vomiting)    Urinary incontinence, stress    Wears glasses    Reading    Past Surgical History:  Procedure Laterality Date   BREAST ENHANCEMENT SURGERY     COLONOSCOPY  04/2017   DILATATION & CURETTAGE/HYSTEROSCOPY WITH MYOSURE N/A 02/23/2018   Procedure: DILATATION & CURETTAGE/HYSTEROSCOPY WITH MYOSURE;  Surgeon: Salvadore Dom, MD;  Location: [facility];  Service: Gynecology;  Laterality: N/A;   FACIAL COSMETIC SURGERY     NASAL SEPTUM SURGERY     WISDOM TOOTH EXTRACTION      Family History  Problem Relation Age of Onset   Colon polyps  Father    Heart disease Father 18   Stroke Father    Diabetes Mother    Stroke Maternal Grandfather    Colon cancer Neg Hx     Social History   Tobacco Use   Smoking status: Never   Smokeless tobacco: Never  Vaping Use   Vaping Use: Never used  Substance Use Topics   Alcohol use: No    Alcohol/week: 0.0 standard drinks   Drug use: No     Allergies  Allergen Reactions   Gluten Meal Other (See Comments)    Abdominal pain   Lactose Intolerance (Gi)     Review of Systems REFER TO HPI FOR PERTINENT POSITIVES AND NEGATIVES      Objective:     BP 109/69   Pulse 72   Temp (!) 97 F (36.1 C)   Ht 5\' 3"  (1.6 m)   Wt 119 lb (54 kg)   LMP 06/25/2018   SpO2 98%   BMI 21.08 kg/m   Wt Readings from Last 3 Encounters:  03/06/21 119 lb (54 kg)  12/21/20 119 lb 3.2 oz (54.1 kg)  08/01/20 119 lb 6.4 oz (54.2 kg)    BP Readings from Last 3 Encounters:  03/06/21 109/69  12/21/20 118/70  08/01/20 (!) 190/62     Physical Exam Vitals and nursing note reviewed.  Constitutional:  Appearance: Normal appearance. She is normal weight. She is not toxic-appearing.  HENT:     Head: Normocephalic and atraumatic.     Right Ear: External ear normal.     Left Ear: External ear normal.     Nose: Nose normal.     Mouth/Throat:     Mouth: Mucous membranes are moist.  Eyes:     Extraocular Movements: Extraocular movements intact.     Conjunctiva/sclera: Conjunctivae normal.     Pupils: Pupils are equal, round, and reactive to light.  Cardiovascular:     Rate and Rhythm: Normal rate and regular rhythm.     Pulses: Normal pulses.     Heart sounds: Normal heart sounds.  Pulmonary:     Effort: Pulmonary effort is normal.     Breath sounds: Normal breath sounds.  Abdominal:     General: Abdomen is flat. Bowel sounds are normal.     Palpations: Abdomen is soft.  Musculoskeletal:        General: Normal range of motion.     Cervical back: Normal range of motion and neck  supple.  Skin:    General: Skin is warm and dry.  Neurological:     General: No focal deficit present.     Mental Status: She is alert and oriented to person, place, and time.  Psychiatric:        Mood and Affect: Mood normal.        Behavior: Behavior normal.        Thought Content: Thought content normal.        Judgment: Judgment normal.       Assessment & Plan:   Problem List Items Addressed This Visit       Endocrine   Hypothyroidism   Relevant Orders   Thyroid Panel With TSH (Completed)   Other Visit Diagnoses     Other chest pain    -  Primary   Relevant Orders   EKG 12-Lead (Completed)   Ambulatory referral to Cardiology   CBC with Differential/Platelet (Completed)   Comprehensive metabolic panel (Completed)   Thyroid Panel With TSH (Completed)   Troponin I (High Sensitivity) (Completed)   D-dimer, quantitative (Completed)   B Nat Peptide (Completed)   DG Chest 2 View (Completed)   Shortness of breath       Relevant Orders   Ambulatory referral to Cardiology   CBC with Differential/Platelet (Completed)   Comprehensive metabolic panel (Completed)   Thyroid Panel With TSH (Completed)   Troponin I (High Sensitivity) (Completed)   D-dimer, quantitative (Completed)   B Nat Peptide (Completed)   DG Chest 2 View (Completed)   Hormone replacement therapy       Relevant Orders   Ambulatory referral to Cardiology   CBC with Differential/Platelet (Completed)   Comprehensive metabolic panel (Completed)   Thyroid Panel With TSH (Completed)   Troponin I (High Sensitivity) (Completed)   D-dimer, quantitative (Completed)   B Nat Peptide (Completed)   DG Chest 2 View (Completed)      1. Other chest pain 2. Shortness of breath 3. Hormone replacement therapy 4. Other specified hypothyroidism I personally reviewed the triage note from yesterday, where patient called in explaining her symptoms and refused to go to the emergency department.  I am concerned about  possible cardiac etiology.  She is at a higher risk because of the hormonal supplements she has been taking. Thankfully, her EKG is in normal sinus rhythm today without any ST or T wave  changes.  However, I do feel it necessary to draw stat labs including troponin, D-dimer, BNP, CBC, CMP, thyroid panel.  I would also like for her to have a chest x-ray done.  Ideally, she would have had all of this done in the emergency department, but as she is currently feeling better today I have agreed to try this as outpatient.  She understands a very low threshold to go to the emergency department if she has any sudden chest pain or pressure, shortness of breath, weakness on one side, worst headache of life, or other acute concerning symptoms.  Pending that everything comes back as normal, I still feel that she needs to have follow-up with cardiology, especially if she plans to continue on these hormonal supplements and with her family history of heart disease.   This note was prepared with assistance of Systems analyst. Occasional wrong-word or sound-a-like substitutions may have occurred due to the inherent limitations of voice recognition software.  In addition to time spent for ekg, I spent 45 minutes of total time on the date of the encounter performing the following actions: chart review prior to seeing the patient, obtaining history, performing a medically necessary exam, counseling on the treatment plan, placing orders, and documenting in our EHR.     Kalix Meinecke M Renetta Suman, PA-C

## 2021-03-07 ENCOUNTER — Encounter: Payer: Self-pay | Admitting: Physician Assistant

## 2021-03-08 NOTE — Telephone Encounter (Signed)
Noted  

## 2021-04-12 ENCOUNTER — Ambulatory Visit: Payer: BC Managed Care – PPO | Admitting: Cardiovascular Disease

## 2021-04-16 ENCOUNTER — Other Ambulatory Visit: Payer: Self-pay

## 2021-04-16 ENCOUNTER — Encounter: Payer: Self-pay | Admitting: Internal Medicine

## 2021-04-16 ENCOUNTER — Ambulatory Visit: Payer: BC Managed Care – PPO | Admitting: Internal Medicine

## 2021-04-16 VITALS — BP 110/68 | HR 67 | Ht 63.0 in | Wt 122.6 lb

## 2021-04-16 DIAGNOSIS — R002 Palpitations: Secondary | ICD-10-CM

## 2021-04-16 NOTE — Patient Instructions (Signed)

## 2021-04-16 NOTE — Progress Notes (Signed)
Cardiology Office Note:    Date:  04/16/2021   ID:  Tranisha Tissue, DOB [birth year], MRN [id number]  PCP:  Patient, No Pcp Per (Inactive)   [facility] Providers Cardiologist:  Kate Sable, MD (Inactive)     Referring MD: Allwardt, Randa Evens, PA-C   No chief complaint on file. Palpitations  History of Present Illness:    Anna Young is a 60 y.o. female with a hx of migraine, hormone therapy referral for palpitations   She had an episode of palpitations. She got an EKG it did not show any significant abnormalities. She thinks may be related to synthroid. She is off that. She noted some palpitations a few weeks ago. She has no LH or dizziness. Two years ago she had an episode like this and she took herbs for menopause. She stopped it and it was fine. She denies syncope. She drinks  1-2 cups of caffeine per day. She has no hx of cigarette smoking and no etoh. Father had 3v CABG and may have a device; he's 47. Mother has no heart disease. No family hx of SCD. No hx of congenital heart dx. She's on hormone therapy. Her blood pressures are normal.   EKG 03/06/2021: NSR   Past Medical History:  Diagnosis Date   Abnormal uterine bleeding (AUB)    Cervical osteoarthritis    Neck   Endometrial polyp    Fibroids, intramural 2014   Frozen shoulder, right    Hypothyroidism    Originally Dx by Endocrinology in 2009   Migraines    PONV (postoperative nausea and vomiting)    Urinary incontinence, stress    Wears glasses    Reading    Past Surgical History:  Procedure Laterality Date   BREAST ENHANCEMENT SURGERY     COLONOSCOPY  04/2017   DILATATION & CURETTAGE/HYSTEROSCOPY WITH MYOSURE N/A 02/23/2018   Procedure: DILATATION & CURETTAGE/HYSTEROSCOPY WITH MYOSURE;  Surgeon: Salvadore Dom, MD;  Location: [place];  Service: Gynecology;  Laterality: N/A;   FACIAL COSMETIC SURGERY     NASAL SEPTUM SURGERY     WISDOM TOOTH EXTRACTION       Current Medications: No outpatient medications have been marked as taking for the 04/16/21 encounter (Appointment) with Janina Mayo, MD.     Allergies:   Gluten meal and Lactose intolerance (gi)   Social History   Socioeconomic History   Marital status: Married    Spouse name: Not on file   Number of children: 3   Years of education: Not on file   Highest education level: Not on file  Occupational History   Occupation: Realtor  Tobacco Use   Smoking status: Never   Smokeless tobacco: Never  Vaping Use   Vaping Use: Never used  Substance and Sexual Activity   Alcohol use: No    Alcohol/week: 0.0 standard drinks   Drug use: No   Sexual activity: Yes    Partners: Male    Birth control/protection: Other-see comments    Comment: Husband - vasectomy  Other Topics Concern   Not on file  Social History Narrative   Not on file   Social Determinants of Health   Financial Resource Strain: Not on file  Food Insecurity: Not on file  Transportation Needs: Not on file  Physical Activity: Not on file  Stress: Not on file  Social Connections: Not on file     Family History: The patient's family history includes Colon polyps in her father; Diabetes  in her mother; Heart disease (age of onset: 7) in her father; Stroke in her father and maternal grandfather. There is no history of Colon cancer.  ROS:   Please see the history of present illness.     All other systems reviewed and are negative.  EKGs/Labs/Other Studies Reviewed:    The following studies were reviewed today:   EKG:  EKG is  ordered today.  The ekg ordered today demonstrates   NSR, infrequent PACs  Recent Labs: 03/06/2021: ALT 6; BUN 14; Creatinine, Ser 0.72; Hemoglobin 13.0; Platelets 416.0; Potassium 5.0; Pro B Natriuretic peptide (BNP) 40.0; Sodium 141; TSH <0.01  Recent Lipid Panel    Component Value Date/Time   CHOL 183 01/13/2020 0833   TRIG 116 01/13/2020 0833   HDL 43 01/13/2020 0833    CHOLHDL 4.3 01/13/2020 0833   LDLCALC 119 (H) 01/13/2020 0833     Risk Assessment/Calculations:           Physical Exam:    VS:  LMP 06/25/2018     Wt Readings from Last 3 Encounters:  03/06/21 119 lb (54 kg)  12/21/20 119 lb 3.2 oz (54.1 kg)  08/01/20 119 lb 6.4 oz (54.2 kg)     GEN:  Well nourished, well developed in no acute distress HEENT: Normal NECK: No JVD; No carotid bruits LYMPHATICS: No lymphadenopathy CARDIAC: RRR, no murmurs, rubs, gallops RESPIRATORY:  Clear to auscultation without rales, wheezing or rhonchi  ABDOMEN: Soft, non-tender, non-distended MUSCULOSKELETAL:  No edema; No deformity  SKIN: Warm and dry NEUROLOGIC:  Alert and oriented x 3 PSYCHIATRIC:  Normal affect   ASSESSMENT:    #PAC/Palpitations: She has infrequent episodes of palpitations. She does not have high risk features including syncope c/f arrhythmia , family hx of SCD. EKG does not show pre-excitation or Brugada pattern. We discussed that the data is mixed for hormone therapy and risk of heart disease; essentially the recommendations is taking the lowest dose for the shortest period of time, we discussed this.   PLAN:    In order of problems listed above:  Follow up PRN     Medication Adjustments/Labs and Tests Ordered: Current medicines are reviewed at length with the patient today.  Concerns regarding medicines are outlined above.    Signed, Janina Mayo, MD  04/16/2021 10:07 AM    [facility]

## 2021-05-30 ENCOUNTER — Other Ambulatory Visit: Payer: Self-pay

## 2021-05-30 ENCOUNTER — Encounter: Payer: Self-pay | Admitting: Obstetrics and Gynecology

## 2021-05-30 ENCOUNTER — Ambulatory Visit (INDEPENDENT_AMBULATORY_CARE_PROVIDER_SITE_OTHER): Payer: BC Managed Care – PPO | Admitting: Obstetrics and Gynecology

## 2021-05-30 VITALS — BP 110/64 | HR 88 | Wt 122.0 lb

## 2021-05-30 DIAGNOSIS — R102 Pelvic and perineal pain: Secondary | ICD-10-CM | POA: Diagnosis not present

## 2021-05-30 NOTE — Progress Notes (Signed)
GYNECOLOGY  VISIT   HPI: 61 y.o.   Married White or Caucasian Not Hispanic or Latino  female   [phone] with Patient's last menstrual period was 06/25/2018.   here for she is having lower abdominal cramps for a couple of weeks. She states that they are mild.   She was evaluated for PMP bleeding last year (she had been on HRT from the [place]). Endometrial biopsy was benign in 3/22.   In the fall her primary put her on a low dose estrogen patch and 100 mg of progesterone for vasomotor symptoms. A couple of weeks ago she started having premenstrual cramps so she stopped the HRT. No vaginal bleeding.  The cramps are mild and intermittent, feel premenstrual.  She has a h/o IBS-C, on medication and her BM's are daily and normal.   GYNECOLOGIC HISTORY: Patient's last menstrual period was 06/25/2018. Contraception:PMP  Menopausal hormone therapy: currently not taking anything         OB History     Gravida  3   Para  3   Term  3   Preterm  0   AB  0   Living  3      SAB  0   IAB  0   Ectopic  0   Multiple  0   Live Births  3              Patient Active Problem List   Diagnosis Date Noted   Hypothyroidism 12/21/2019   Hypothyroid    Frozen shoulder 04/10/2017   Migraine 11/01/2015   DJD (degenerative joint disease), cervical 10/06/2015   Nonallopathic lesion of cervical region 10/06/2015   Nonallopathic lesion of thoracic region 10/06/2015   Nonallopathic lesion of lumbosacral region 10/06/2015   Constipation 02/10/2013   Fibroids 02/10/2013   Urinary incontinence 02/10/2013    Past Medical History:  Diagnosis Date   Abnormal uterine bleeding (AUB)    Cervical osteoarthritis    Neck   Endometrial polyp    Fibroids, intramural 2014   Frozen shoulder, right    Hypothyroidism    Originally Dx by Endocrinology in 2009   Migraines    PONV (postoperative nausea and vomiting)    Urinary incontinence, stress    Wears glasses    Reading    Past  Surgical History:  Procedure Laterality Date   BREAST ENHANCEMENT SURGERY     COLONOSCOPY  04/2017   DILATATION & CURETTAGE/HYSTEROSCOPY WITH MYOSURE N/A 02/23/2018   Procedure: DILATATION & CURETTAGE/HYSTEROSCOPY WITH MYOSURE;  Surgeon: Salvadore Dom, MD;  Location: [place];  Service: Gynecology;  Laterality: N/A;   FACIAL COSMETIC SURGERY     NASAL SEPTUM SURGERY     WISDOM TOOTH EXTRACTION      Current Outpatient Medications  Medication Sig Dispense Refill   MOTEGRITY 2 MG TABS Take 1 tablet by mouth daily.     Multiple Vitamin (MULTI-VITAMINS PO) Take by mouth daily.     thyroid (ARMOUR) 60 MG tablet Take 60 mg by mouth daily before breakfast.     estradiol (VIVELLE-DOT) 0.025 MG/24HR 1 patch 2 (two) times a week. (Patient not taking: Reported on 05/30/2021)     progesterone (PROMETRIUM) 100 MG capsule Take by mouth. (Patient not taking: Reported on 05/30/2021)     No current facility-administered medications for this visit.     ALLERGIES: Gluten meal and Lactose intolerance (gi)  Family History  Problem Relation Age of Onset   Colon polyps Father  Heart disease Father 36   Stroke Father    Diabetes Mother    Stroke Maternal Grandfather    Colon cancer Neg Hx     Social History   Socioeconomic History   Marital status: Married    Spouse name: Not on file   Number of children: 3   Years of education: Not on file   Highest education level: Not on file  Occupational History   Occupation: Realtor  Tobacco Use   Smoking status: Never   Smokeless tobacco: Never  Vaping Use   Vaping Use: Never used  Substance and Sexual Activity   Alcohol use: No    Alcohol/week: 0.0 standard drinks   Drug use: No   Sexual activity: Yes    Partners: Male    Birth control/protection: Other-see comments    Comment: Husband - vasectomy  Other Topics Concern   Not on file  Social History Narrative   Not on file   Social Determinants of Health    Financial Resource Strain: Not on file  Food Insecurity: Not on file  Transportation Needs: Not on file  Physical Activity: Not on file  Stress: Not on file  Social Connections: Not on file  Intimate Partner Violence: Not on file    ROS  PHYSICAL EXAMINATION:    BP 110/64    Pulse 88    Wt 122 lb (55.3 kg)    LMP 06/25/2018    SpO2 98%    BMI 21.61 kg/m     General appearance: alert, cooperative and appears stated age Abdomen: soft, non-tender; non distended, no masses,  no organomegaly  Pelvic: External genitalia:  no lesions              Urethra:  normal appearing urethra with no masses, tenderness or lesions              Bartholins and Skenes: normal                 Vagina: normal appearing vagina with normal color and discharge, no lesions              Cervix: anteverted, no cervical motion tenderness, and no lesions              Bimanual Exam:  Uterus:  normal size, contour, position, consistency, mobility, non-tender              Adnexa: no mass, fullness, tenderness              Rectovaginal: Yes.  .  Confirms.              Anus:  normal sphincter tone, no lesions  Chaperone was present for exam.  1. Pelvic cramping Normal exam - US PELVIS TRANSVAGINAL NON-OB (TV ONLY); Future -If her ultrasound is normal, would recommend that she f/u with her primary or GI

## 2021-05-31 ENCOUNTER — Ambulatory Visit: Payer: BC Managed Care – PPO

## 2021-05-31 DIAGNOSIS — R102 Pelvic and perineal pain: Secondary | ICD-10-CM | POA: Diagnosis not present

## 2021-06-05 ENCOUNTER — Telehealth: Payer: Self-pay | Admitting: Obstetrics and Gynecology

## 2021-06-05 NOTE — Telephone Encounter (Signed)
Spoke with patient and informed her of Dr. Gentry Fitz message/result and recommendation.

## 2021-06-05 NOTE — Telephone Encounter (Signed)
Please let the patient know that her ultrasound is essentially normal. Her fibroids are smaller, normal atrophic ovaries bilaterally. Nothing seen to explain her pain. I would recommend she f/u with GI.

## 2021-10-10 ENCOUNTER — Other Ambulatory Visit: Payer: BC Managed Care – PPO

## 2021-10-10 ENCOUNTER — Inpatient Hospital Stay: Payer: BC Managed Care – PPO | Attending: Genetic Counselor | Admitting: Genetic Counselor

## 2021-10-10 ENCOUNTER — Other Ambulatory Visit: Payer: Self-pay | Admitting: Genetic Counselor

## 2021-10-10 ENCOUNTER — Other Ambulatory Visit: Payer: Self-pay

## 2021-10-10 ENCOUNTER — Encounter: Payer: Self-pay | Admitting: Genetic Counselor

## 2021-10-10 DIAGNOSIS — Z8042 Family history of malignant neoplasm of prostate: Secondary | ICD-10-CM | POA: Diagnosis not present

## 2021-10-10 DIAGNOSIS — Z803 Family history of malignant neoplasm of breast: Secondary | ICD-10-CM

## 2021-10-10 NOTE — Progress Notes (Signed)
REFERRING PROVIDER: Salvadore Dom, [address],  [place] [address]  PRIMARY PROVIDER:  Patient, No Pcp Per (Inactive)  PRIMARY REASON FOR VISIT:  1. Family history of breast cancer   2. Family history of prostate cancer      HISTORY OF PRESENT ILLNESS:   Anna Young, a 61 y.o. female, was seen for a [place] cancer genetics consultation at the request of Dr. Talbert Nan due to a family history of breast and prostate cancer.  Anna Young presents to clinic today to discuss the possibility of a hereditary predisposition to cancer, genetic testing, and to further clarify her future cancer risks, as well as potential cancer risks for family members.   Anna Young is a 61 y.o. female with no personal history of cancer.    CANCER HISTORY:  Oncology History   No history exists.     RISK FACTORS:  Menarche was at age 33.  First live birth at age 62.  OCP use for approximately 1 years.  Ovaries intact: yes.  Hysterectomy: no.  Menopausal status: postmenopausal.  HRT use: 1 years. Colonoscopy: yes; normal. Mammogram within the last year: yes. Number of breast biopsies: 0. Up to date with pelvic exams: yes. Any excessive radiation exposure in the past: no  Past Medical History:  Diagnosis Date   Abnormal uterine bleeding (AUB)    Cervical osteoarthritis    Neck   Endometrial polyp    Family history of breast cancer    Family history of prostate cancer    Fibroids, intramural 2014   Frozen shoulder, right    Hypothyroidism    Originally Dx by Endocrinology in 2009   Migraines    PONV (postoperative nausea and vomiting)    Urinary incontinence, stress    Wears glasses    Reading    Past Surgical History:  Procedure Laterality Date   BREAST ENHANCEMENT SURGERY     COLONOSCOPY  04/2017   DILATATION & CURETTAGE/HYSTEROSCOPY WITH MYOSURE N/A 02/23/2018   Procedure: DILATATION & CURETTAGE/HYSTEROSCOPY WITH MYOSURE;  Surgeon: Salvadore Dom, MD;  Location: [place];  Service: Gynecology;  Laterality: N/A;   FACIAL COSMETIC SURGERY     NASAL SEPTUM SURGERY     WISDOM TOOTH EXTRACTION      Social History   Socioeconomic History   Marital status: Married    Spouse name: Not on file   Number of children: 3   Years of education: Not on file   Highest education level: Not on file  Occupational History   Occupation: Realtor  Tobacco Use   Smoking status: Never   Smokeless tobacco: Never  Vaping Use   Vaping Use: Never used  Substance and Sexual Activity   Alcohol use: No    Alcohol/week: 0.0 standard drinks   Drug use: No   Sexual activity: Yes    Partners: Male    Birth control/protection: Other-see comments    Comment: Husband - vasectomy  Other Topics Concern   Not on file  Social History Narrative   Not on file   Social Determinants of Health   Financial Resource Strain: Not on file  Food Insecurity: Not on file  Transportation Needs: Not on file  Physical Activity: Not on file  Stress: Not on file  Social Connections: Not on file     FAMILY HISTORY:  We obtained a detailed, 4-generation family history.  Significant diagnoses are listed below: Family History  Problem Relation Age of  Onset   Diabetes Mother    Colon polyps Father    Heart disease Father 64   Stroke Father    Breast cancer Maternal Aunt 74   Cancer Paternal Aunt 79       NOS   Breast cancer Maternal Grandmother 76   Stroke Maternal Grandfather    Other Paternal Grandmother        housefire   Colon cancer Neg Hx      The patient has three children who are cancer free. She has a brother and two sisters who are cancer free.  Both parents are living.  The patient's father is 21.  He was diagnosed with prostate cancer in his 14's.  He has two maternal half sisters and a half brother.  One sister had cancer diagnosed at 67.  The maternal grandparents are deceased from non-cancer related issues.  The  patient's mother is 61.  She had four sisters and a brother.  One sister had breast cancer at 66. The maternal grandparents are deceased. The grandmother had breast cancer.  Anna Young is unaware of previous family history of genetic testing for hereditary cancer risks. Patient's maternal ancestors are of [place], [place], [place] descent, and paternal ancestors are of [place] descent. There is no reported Ashkenazi Jewish ancestry. There is no known consanguinity.  GENETIC COUNSELING ASSESSMENT: Anna Young is a 61 y.o. female with a family history of breast and prostate cancer which is somewhat suggestive of a familial cancer syndrome. We, therefore, discussed and recommended the following at today's visit.   DISCUSSION: We discussed that, in general, most cancer is not inherited in families, but instead is sporadic or familial. Sporadic cancers occur by chance and typically happen at older ages (>50 years) as this type of cancer is caused by genetic changes acquired during an individual's lifetime. Some families have more cancers than would be expected by chance; however, the ages or types of cancer are not consistent with a known genetic mutation or known genetic mutations have been ruled out. This type of familial cancer is thought to be due to a combination of multiple genetic, environmental, hormonal, and lifestyle factors. While this combination of factors likely increases the risk of cancer, the exact source of this risk is not currently identifiable or testable.  We discussed that 5 - 10% of breast cancer is hereditary, with most cases associated with BRCA mutations.  There are other genes that can be associated with hereditary breast cancer syndromes.  These include ATM, CHEK2 and PALB2.  We discussed that testing is beneficial for several reasons including knowing how to follow individuals after completing their treatment, identifying whether potential treatment options such as PARP inhibitors  would be beneficial, and understand if other family members could be at risk for cancer and allow them to undergo genetic testing.   We discussed that some people do not want to undergo genetic testing due to fear of genetic discrimination.  A federal law called the Genetic Information Non-Discrimination Act (GINA) of 2008 helps protect individuals against genetic discrimination based on their genetic test results.  It impacts both health insurance and employment.  With health insurance, it protects against increased premiums, being kicked off insurance or being forced to take a test in order to be insured.  For employment it protects against hiring, firing and promoting decisions based on genetic test results.  Health status due to a cancer diagnosis is not protected under GINA.   We reviewed the characteristics, features and  inheritance patterns of hereditary cancer syndromes. We also discussed genetic testing, including the appropriate family members to test, the process of testing, insurance coverage and turn-around-time for results. We discussed the implications of a negative, positive, carrier and/or variant of uncertain significant result. We recommended Anna Young pursue genetic testing for the CancerNext-Expanded+RNAinsight gene panel.   The CancerNext-Expanded gene panel offered by [facility] and includes sequencing and rearrangement analysis for the following 77 genes: AIP, ALK, APC*, ATM*, AXIN2, BAP1, BARD1, BLM, BMPR1A, BRCA1*, BRCA2*, BRIP1*, CDC73, CDH1*, CDK4, CDKN1B, CDKN2A, CHEK2*, CTNNA1, DICER1, FANCC, FH, FLCN, GALNT12, KIF1B, LZTR1, MAX, MEN1, MET, MLH1*, MSH2*, MSH3, MSH6*, MUTYH*, NBN, NF1*, NF2, NTHL1, PALB2*, PHOX2B, PMS2*, POT1, PRKAR1A, PTCH1, PTEN*, RAD51C*, RAD51D*, RB1, RECQL, RET, SDHA, SDHAF2, SDHB, SDHC, SDHD, SMAD4, SMARCA4, SMARCB1, SMARCE1, STK11, SUFU, TMEM127, TP53*, TSC1, TSC2, VHL and XRCC2 (sequencing and deletion/duplication); EGFR, EGLN1, HOXB13, KIT, MITF,  PDGFRA, POLD1, and POLE (sequencing only); EPCAM and GREM1 (deletion/duplication only). DNA and RNA analyses performed for * genes.   We discussed with Anna Young that the family history does not meet insurance or NCCN criteria for genetic testing and, therefore, is not highly consistent with a familial hereditary cancer syndrome.  We feel she is at low risk to harbor a gene mutation associated with such a condition. We discussed that her out of pocket cost for testing would be approximately $250.   Based on the patient's family history, a statistical model (Tyrer Cusik) was used to estimate her risk of developing breast cancer. This estimates her lifetime risk of developing breast cancer to be approximately 10%. This estimation does not consider any genetic testing results.  The patient's lifetime breast cancer risk is a preliminary estimate based on available information using one of several models endorsed by the [place] (ACS). The ACS recommends consideration of breast MRI screening as an adjunct to mammography for patients at high risk (defined as 20% or greater lifetime risk). Please note that a woman's breast cancer risk changes over time. It may increase or decrease based on age and any changes to the personal and/or family medical history. The risks and recommendations listed above apply to this patient at this point in time. In the future, she may or may not be eligible for the same medical management strategies and, in some cases, other medical management strategies may become available to her. If she is interested in an updated breast cancer risk assessment at a later date, she can contact us.     PLAN: Despite our recommendation, Anna Young did not wish to pursue genetic testing at today's visit. We understand this decision and remain available to coordinate genetic testing at any time in the future. We, therefore, recommend Ms. Pergola continue to follow the cancer screening  guidelines given by her primary healthcare provider.  Lastly, we encouraged Anna Young to remain in contact with cancer genetics annually so that we can continuously update the family history and inform her of any changes in cancer genetics and testing that may be of benefit for this family.   Anna Young questions were answered to her satisfaction today. Our contact information was provided should additional questions or concerns arise. Thank you for the referral and allowing [place] to share in the care of your patient.   Anna Young, [place], [facility] Licensed, Insurance risk surveyor [email or web address] phone: [phone]  The patient was seen for a total of 30 minutes in face-to-face genetic counseling.  The patient was seen alone. This patient was  discussed with Drs. Magrinat, Lindi Adie and/or Burr Medico who agrees with the above.    _______________________________________________________________________ For Office Staff:  Number of people involved in session: 1 Was an Intern/ student involved with case: no

## 2021-12-28 ENCOUNTER — Other Ambulatory Visit: Payer: Self-pay | Admitting: Obstetrics and Gynecology

## 2022-01-01 ENCOUNTER — Ambulatory Visit: Payer: BC Managed Care – PPO | Admitting: Obstetrics and Gynecology

## 2022-01-03 ENCOUNTER — Encounter: Payer: Self-pay | Admitting: Obstetrics and Gynecology

## 2022-01-16 ENCOUNTER — Ambulatory Visit: Payer: BC Managed Care – PPO | Admitting: Obstetrics and Gynecology

## 2022-01-21 NOTE — Progress Notes (Signed)
61 y.o. G12P3003 Married White or Caucasian Not Hispanic or Latino female here for annual exam.  No vaginal bleeding. No dyspareunia.  She has some vasomotor symptoms, helped with OTC herbal supplements.   Feels bloated, has been an issue since she was 6. Worse in the last few months. She stopped her hormone therapy. Gets bloated as the day goes on. Having a BM 1-2 x a week. She sees GI, has tried everything, nothing is working.   She would like a referral to endo.  Has hypothyroidism, was managed by [facility] Dell, but would like to go to an Endocrinologist.     Patient's last menstrual period was 06/13/2018.          Sexually active: Yes.    The current method of family planning is vaginal spermicide and post menopausal status.    Exercising: No.  The patient does not participate in regular exercise at present. Smoker:  no  Health Maintenance: Pap:   12/21/19  WNL Hr HPV neg, 10/3/19WNL HPV Neg   03-02-15 WNL NEG HR HPV  History of abnormal Pap:  yes many years ago  MMG:  12/26/21 Bi-rads 2 benign  BMD:   none  Colonoscopy:  12/19/20, f/u 5 years  TDaP:  05/28/17 Gardasil: n/a   reports that she has never smoked. She has never used smokeless tobacco. She reports that she does not drink alcohol and does not use drugs. 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   Past Medical History:  Diagnosis Date   Abnormal uterine bleeding (AUB)    Cervical osteoarthritis    Neck   Endometrial polyp    Family history of breast cancer    Family history of prostate cancer    Fibroids, intramural 2014   Frozen shoulder, right    Hypothyroidism    Originally Dx by Endocrinology in 2009   Migraines    PONV (postoperative nausea and vomiting)    Urinary incontinence, stress    Wears glasses    Reading    Past Surgical History:  Procedure Laterality Date   BREAST ENHANCEMENT SURGERY     COLONOSCOPY  04/2017   DILATATION & CURETTAGE/HYSTEROSCOPY WITH MYOSURE N/A 02/23/2018    Procedure: DILATATION & CURETTAGE/HYSTEROSCOPY WITH MYOSURE;  Surgeon: Salvadore Dom, MD;  Location: [facility];  Service: Gynecology;  Laterality: N/A;   FACIAL COSMETIC SURGERY     NASAL SEPTUM SURGERY     WISDOM TOOTH EXTRACTION      Current Outpatient Medications  Medication Sig Dispense Refill   levothyroxine (SYNTHROID) 25 MCG tablet TAKE 2 TABLETS (50 MCG TOTAL) BY MOUTH DAILY.     MOTEGRITY 2 MG TABS Take 1 tablet by mouth daily.     Multiple Vitamin (MULTI-VITAMINS PO) Take by mouth daily.     thyroid (ARMOUR) 60 MG tablet Take 60 mg by mouth daily before breakfast.     No current facility-administered medications for this visit.    Family History  Problem Relation Age of Onset   Diabetes Mother    Colon polyps Father    Heart disease Father 23   Stroke Father    Breast cancer Maternal Aunt 10   Cancer Paternal Aunt 72       NOS   Breast cancer Maternal Grandmother 85   Stroke Maternal Grandfather    Other Paternal Grandmother        housefire   Colon cancer Neg Hx  Review of Systems  All other systems reviewed and are negative.   Exam:   BP 118/80   Pulse 88   Ht '5\' 3"'$  (1.6 m)   Wt 124 lb (56.2 kg)   LMP 06/13/2018   SpO2 100%   BMI 21.97 kg/m   Weight change: '@WEIGHTCHANGE'$ @ Height:   Height: '5\' 3"'$  (160 cm)  Ht Readings from Last 3 Encounters:  01/24/22 '5\' 3"'$  (1.6 m)  04/16/21 '5\' 3"'$  (1.6 m)  03/06/21 '5\' 3"'$  (1.6 m)    General appearance: alert, cooperative and appears stated age Head: Normocephalic, without obvious abnormality, atraumatic Neck: no adenopathy, supple, symmetrical, trachea midline and thyroid normal to inspection and palpation Lungs: clear to auscultation bilaterally Cardiovascular: regular rate and rhythm Breasts: normal appearance, no masses or tenderness Abdomen: soft, non-tender; non distended,  no masses,  no organomegaly Extremities: extremities normal, atraumatic, no cyanosis or edema Skin: Skin  color, texture, turgor normal. No rashes or lesions Lymph nodes: Cervical, supraclavicular, and axillary nodes normal. No abnormal inguinal nodes palpated Neurologic: Grossly normal   Pelvic: External genitalia:  no lesions              Urethra:  normal appearing urethra with no masses, tenderness or lesions              Bartholins and Skenes: normal                 Vagina: normal appearing vagina with normal color and discharge, no lesions              Cervix: no lesions               Bimanual Exam:  Uterus:  normal size, contour, position, consistency, mobility, non-tender              Adnexa: no mass, fullness, tenderness               Rectovaginal: Confirms               Anus:  normal sphincter tone, no lesions   1. Well woman exam Discussed breast self exam Discussed calcium and vit D intake Mammogram, colonoscopy, and pap are UTD Labs with Franz Dell  2. Bloating Long h/o bloating and constipation. She has seen GI and tried multiple different medications. She is dairy and gluten free Discussed decreasing her caffeine intake and hydrating well  3. Other specified hypothyroidism Will place referral to Endocrinology for management

## 2022-01-24 ENCOUNTER — Ambulatory Visit (INDEPENDENT_AMBULATORY_CARE_PROVIDER_SITE_OTHER): Payer: BC Managed Care – PPO | Admitting: Obstetrics and Gynecology

## 2022-01-24 ENCOUNTER — Encounter: Payer: Self-pay | Admitting: Obstetrics and Gynecology

## 2022-01-24 VITALS — BP 118/80 | HR 88 | Ht 63.0 in | Wt 124.0 lb

## 2022-01-24 DIAGNOSIS — E038 Other specified hypothyroidism: Secondary | ICD-10-CM | POA: Diagnosis not present

## 2022-01-24 DIAGNOSIS — Z01419 Encounter for gynecological examination (general) (routine) without abnormal findings: Secondary | ICD-10-CM

## 2022-01-24 DIAGNOSIS — R140 Abdominal distension (gaseous): Secondary | ICD-10-CM

## 2022-01-24 NOTE — Patient Instructions (Signed)

## 2022-01-25 ENCOUNTER — Telehealth: Payer: Self-pay

## 2022-01-25 DIAGNOSIS — E039 Hypothyroidism, unspecified: Secondary | ICD-10-CM

## 2022-01-25 NOTE — Telephone Encounter (Signed)
Referral faxed successfully.  

## 2022-01-25 NOTE — Telephone Encounter (Signed)
-----   Message from Salvadore Dom, MD sent at 01/24/2022  3:58 PM EDT ----- Can you please place a referral to Endocrinology for hypothyroidism.  Thanks, Sharee Pimple

## 2022-02-01 ENCOUNTER — Ambulatory Visit
Admission: RE | Admit: 2022-02-01 | Discharge: 2022-02-01 | Disposition: A | Discharge status: Home / Self Care | Payer: BC Managed Care – PPO | Source: Ambulatory Visit | Attending: Endocrinology | Admitting: Endocrinology

## 2022-02-01 ENCOUNTER — Other Ambulatory Visit: Payer: Self-pay | Admitting: Endocrinology

## 2022-02-01 DIAGNOSIS — E010 Iodine-deficiency related diffuse (endemic) goiter: Secondary | ICD-10-CM

## 2022-02-15 NOTE — Telephone Encounter (Signed)
FYI. Pt was seen at Dr. Almetta Lovely office on 01/30/2022 and is scheduled for a f/u in 07/2022.

## 2023-02-28 ENCOUNTER — Ambulatory Visit: Payer: No Typology Code available for payment source | Admitting: Physician Assistant

## 2023-02-28 ENCOUNTER — Encounter: Payer: Self-pay | Admitting: Physician Assistant

## 2023-02-28 VITALS — BP 95/63 | HR 77 | Temp 98.2°F | Ht 63.0 in | Wt 118.0 lb

## 2023-02-28 DIAGNOSIS — Z0000 Encounter for general adult medical examination without abnormal findings: Secondary | ICD-10-CM | POA: Diagnosis not present

## 2023-02-28 DIAGNOSIS — Z780 Asymptomatic menopausal state: Secondary | ICD-10-CM

## 2023-02-28 DIAGNOSIS — R1903 Right lower quadrant abdominal swelling, mass and lump: Secondary | ICD-10-CM

## 2023-02-28 LAB — CBC
HCT: 41.1 % (ref 36.0–46.0)
Hemoglobin: 13.5 g/dL (ref 12.0–15.0)
MCHC: 32.7 g/dL (ref 30.0–36.0)
MCV: 91.5 fL (ref 78.0–100.0)
Platelets: 352 10*3/uL (ref 150.0–400.0)
RBC: 4.49 Mil/uL (ref 3.87–5.11)
RDW: 13.5 % (ref 11.5–15.5)
WBC: 5.1 10*3/uL (ref 4.0–10.5)

## 2023-02-28 LAB — LIPID PANEL
Cholesterol: 183 mg/dL (ref 0–200)
HDL: 48.3 mg/dL (ref >39.00)
LDL Cholesterol: 115 mg/dL — ABNORMAL HIGH (ref 0–99)
NonHDL: 135.01
Total CHOL/HDL Ratio: 4
Triglycerides: 102 mg/dL (ref 0.0–149.0)
VLDL: 20.4 mg/dL (ref 0.0–40.0)

## 2023-02-28 LAB — COMPREHENSIVE METABOLIC PANEL
ALT: 9 U/L (ref 0–35)
AST: 17 U/L (ref 0–37)
Albumin: 4.5 g/dL (ref 3.5–5.2)
Alkaline Phosphatase: 67 U/L (ref 39–117)
BUN: 19 mg/dL (ref 6–23)
CO2: 28 meq/L (ref 19–32)
Calcium: 9.9 mg/dL (ref 8.4–10.5)
Chloride: 102 meq/L (ref 96–112)
Creatinine, Ser: 0.71 mg/dL (ref 0.40–1.20)
GFR: 92.07 mL/min (ref >60.00)
Glucose, Bld: 88 mg/dL (ref 70–99)
Potassium: 4.6 meq/L (ref 3.5–5.1)
Sodium: 138 meq/L (ref 135–145)
Total Bilirubin: 0.2 mg/dL (ref 0.2–1.2)
Total Protein: 7.1 g/dL (ref 6.0–8.3)

## 2023-02-28 LAB — VITAMIN B12: Vitamin B-12: 826 pg/mL (ref 211–911)

## 2023-02-28 LAB — HEMOGLOBIN A1C: Hgb A1c MFr Bld: 5.6 % (ref 4.6–6.5)

## 2023-02-28 LAB — VITAMIN D 25 HYDROXY (VIT D DEFICIENCY, FRACTURES): VITD: 76.83 ng/mL (ref 30.00–100.00)

## 2023-02-28 NOTE — Progress Notes (Signed)
Subjective:    Patient ID: Anna Young, female    DOB: June 28, 1960, 62 y.o.   MRN: [id number]  Chief Complaint  Patient presents with   Annual Exam   Transitions Of Care    HPI Patient is in today for annual exam. Sees [facility] for hormones and thyroid levels.   Discussed the use of AI scribe software for clinical note transcription with the patient, who gave verbal consent to proceed.  History of Present Illness   The patient is a 62 year old woman with a history of thyroid issues, hormone imbalances, and bowel issues. She is currently under the care of a holistic practitioner at [facility] who manages her thyroid and hormone levels. She reports feeling energetic and well-balanced on her current regimen. She has a history of dense breast tissue and has been getting annual mammograms for the past 20 years. She also mentions a concern about a firm area in her abdomen, which she describes as being sore when pressed. She has a history of constipation and has had three colonoscopies, the first of which revealed a precancerous polyp. She is currently managing her bowel issues with Motegrity, which she takes approximately once a week.       Health maintenance: Lifestyle/ exercise: yoga & walking, occasional strength training  Nutrition: lactose & gluten intolerant, well-balanced  Mental health: doing well Sleep: doing well  Substance use: none  Sexual activity: monogamous Colonoscopy: three in her lifetime; hx of constipation issues; precancerous polyp at 28, every 5 years now  Pap: follows with GYN  Mammogram: follows with GYN, dense breast tissue   Past Medical History:  Diagnosis Date   Abnormal uterine bleeding (AUB)    Cervical osteoarthritis    Neck   Endometrial polyp    Family history of breast cancer    Family history of prostate cancer    Fibroids, intramural 2014   Frozen shoulder, right    Hypothyroidism    Originally Dx by Endocrinology in 2009    Migraines    PONV (postoperative nausea and vomiting)    Urinary incontinence, stress    Wears glasses    Reading    Past Surgical History:  Procedure Laterality Date   BREAST ENHANCEMENT SURGERY     COLONOSCOPY  04/2017   DILATATION & CURETTAGE/HYSTEROSCOPY WITH MYOSURE N/A 02/23/2018   Procedure: DILATATION & CURETTAGE/HYSTEROSCOPY WITH MYOSURE;  Surgeon: Romualdo Bolk, MD;  Location: [facility];  Service: Gynecology;  Laterality: N/A;   FACIAL COSMETIC SURGERY     NASAL SEPTUM SURGERY     WISDOM TOOTH EXTRACTION      Family History  Problem Relation Age of Onset   Diabetes Mother    Colon polyps Father    Heart disease Father 74   Stroke Father    Breast cancer Maternal Aunt 67   Cancer Paternal Aunt 29       NOS   Breast cancer Maternal Grandmother 49   Stroke Maternal Grandfather    Other Paternal Grandmother        housefire   Colon cancer Neg Hx     Social History   Tobacco Use   Smoking status: Never   Smokeless tobacco: Never  Vaping Use   Vaping status: Never Used  Substance Use Topics   Alcohol use: No    Alcohol/week: 0.0 standard drinks of alcohol   Drug use: No     Allergies  Allergen Reactions   Gluten Meal Other (See Comments)  Abdominal pain   Lactose Intolerance (Gi)     Review of Systems NEGATIVE UNLESS OTHERWISE INDICATED IN HPI      Objective:     BP 95/63   Pulse 77   Temp 98.2 F (36.8 C) (Temporal)   Ht 5\' 3"  (1.6 m)   Wt 118 lb (53.5 kg)   LMP 06/13/2018   SpO2 98%   BMI 20.90 kg/m   Wt Readings from Last 3 Encounters:  02/28/23 118 lb (53.5 kg)  01/24/22 124 lb (56.2 kg)  05/30/21 122 lb (55.3 kg)    BP Readings from Last 3 Encounters:  02/28/23 95/63  01/24/22 118/80  05/30/21 110/64     Physical Exam Vitals and nursing note reviewed.  Constitutional:      Appearance: Normal appearance. She is normal weight. She is not toxic-appearing.  HENT:     Head: Normocephalic and  atraumatic.     Right Ear: External ear normal.     Left Ear: External ear normal.     Nose: Nose normal.     Mouth/Throat:     Mouth: Mucous membranes are moist.  Eyes:     Extraocular Movements: Extraocular movements intact.     Conjunctiva/sclera: Conjunctivae normal.     Pupils: Pupils are equal, round, and reactive to light.  Cardiovascular:     Rate and Rhythm: Normal rate and regular rhythm.     Pulses: Normal pulses.     Heart sounds: Normal heart sounds.  Pulmonary:     Effort: Pulmonary effort is normal.     Breath sounds: Normal breath sounds.  Abdominal:     General: Abdomen is flat. Bowel sounds are normal.     Palpations: Abdomen is soft. There is mass (right middle abdomen firm slightly tender small mass).  Musculoskeletal:        General: Normal range of motion.     Cervical back: Normal range of motion and neck supple.  Skin:    General: Skin is warm and dry.  Neurological:     General: No focal deficit present.     Mental Status: She is alert and oriented to person, place, and time.  Psychiatric:        Mood and Affect: Mood normal.        Behavior: Behavior normal.        Thought Content: Thought content normal.        Judgment: Judgment normal.        Assessment & Plan:  General medical exam -     CBC -     Comprehensive metabolic panel -     Hemoglobin A1c -     Lipid panel -     VITAMIN D 25 Hydroxy (Vit-D Deficiency, Fractures) -     Vitamin B12  Postmenopausal -     VITAMIN D 25 Hydroxy (Vit-D Deficiency, Fractures) -     Vitamin B12  Right lower quadrant abdominal mass -     US Abdomen Complete; Future   Assessment and Plan    Right-sided abdominal mass Firm, tender mass palpated in the right mid-abdomen. Unclear if this is related to chronic constipation or another etiology. -Order right-sided abdominal ultrasound to further evaluate the mass. -Advise patient to monitor symptoms and report any changes.  Chronic  constipation Long-standing history of constipation, currently managed with occasional use of Motegrity. -Continue current management plan. -Encourage regular exercise and a balanced diet to promote bowel regularity.  Breast cancer screening Dense breast  tissue with annual mammograms for the past 20 years. Patient is considering thermography for further evaluation. -Advise patient to continue regular mammograms and ultrasounds as recommended by her gynecologist. -Encourage patient to discuss the results of the upcoming thermography with the provider.  Hypothyroidism Well-managed with Synthroid and T3 supplementation under the care of a holistic provider. -No changes recommended at this time.  Menopausal symptoms Managed with low-dose estrogen patch. No hot flashes reported. -Continue current management plan.  General Health Maintenance -Order labs including CBC, glucose, liver function tests, kidney function tests, cholesterol, B12, and vitamin D. -Encourage patient to continue regular exercise and a balanced diet. -Advise patient to continue regular dermatology checks. -Continue regular colonoscopies as recommended due to history of precancerous polyp.         Return in about 1 year (around 02/28/2024) for physical.    Rafeef Lau M Arlee Santosuosso, PA-C

## 2023-03-06 ENCOUNTER — Telehealth: Payer: Self-pay | Admitting: Physician Assistant

## 2023-03-06 NOTE — Telephone Encounter (Signed)
Alyssa please see call message regarding order and place new order if needing to be changed

## 2023-03-06 NOTE — Telephone Encounter (Signed)
Anna Young from [facility] imaging called stating that she saw on recent imaging order that we are looking for a right lower quadrant mass. Caller states that due to this the order needs to be a pelvis limited instead. Caller is asking this be changed soon so patient can have imaging completed.

## 2023-03-06 NOTE — Telephone Encounter (Signed)
Can you please see message from [facility] and correct order for this patient waiting on imaging.

## 2023-03-07 ENCOUNTER — Other Ambulatory Visit: Payer: Self-pay

## 2023-03-07 NOTE — Telephone Encounter (Signed)
I have returned call two times and lvm to discuss this order. Unable to reach anyone and waiting on a call back. When/If they return call please let PCP or myself know so this order can be corrected and be ensured they are doing ultrasound and what is needed.

## 2023-03-10 ENCOUNTER — Other Ambulatory Visit: Payer: Self-pay | Admitting: Physician Assistant

## 2023-03-10 DIAGNOSIS — R1900 Intra-abdominal and pelvic swelling, mass and lump, unspecified site: Secondary | ICD-10-CM

## 2023-03-10 NOTE — Telephone Encounter (Signed)
Joyce Gross with [facility] returned call and spoke to [facility], order was corrected and advised they would be calling to get patient scheduled.

## 2023-03-19 ENCOUNTER — Inpatient Hospital Stay
Admission: RE | Admit: 2023-03-19 | Discharge: 2023-03-19 | Discharge status: Home / Self Care | Payer: No Typology Code available for payment source | Source: Ambulatory Visit | Attending: Physician Assistant

## 2023-03-19 DIAGNOSIS — R1900 Intra-abdominal and pelvic swelling, mass and lump, unspecified site: Secondary | ICD-10-CM

## 2023-03-25 ENCOUNTER — Encounter: Payer: Self-pay | Admitting: Physician Assistant

## 2023-04-04 IMAGING — DX DG CHEST 2V
2 series · 2 of 2 positions shown · non-contrast
Comparison: None.

CLINICAL DATA: Palpitations, shortness of breath

EXAM:
CHEST - 2 VIEW

[chest pa]
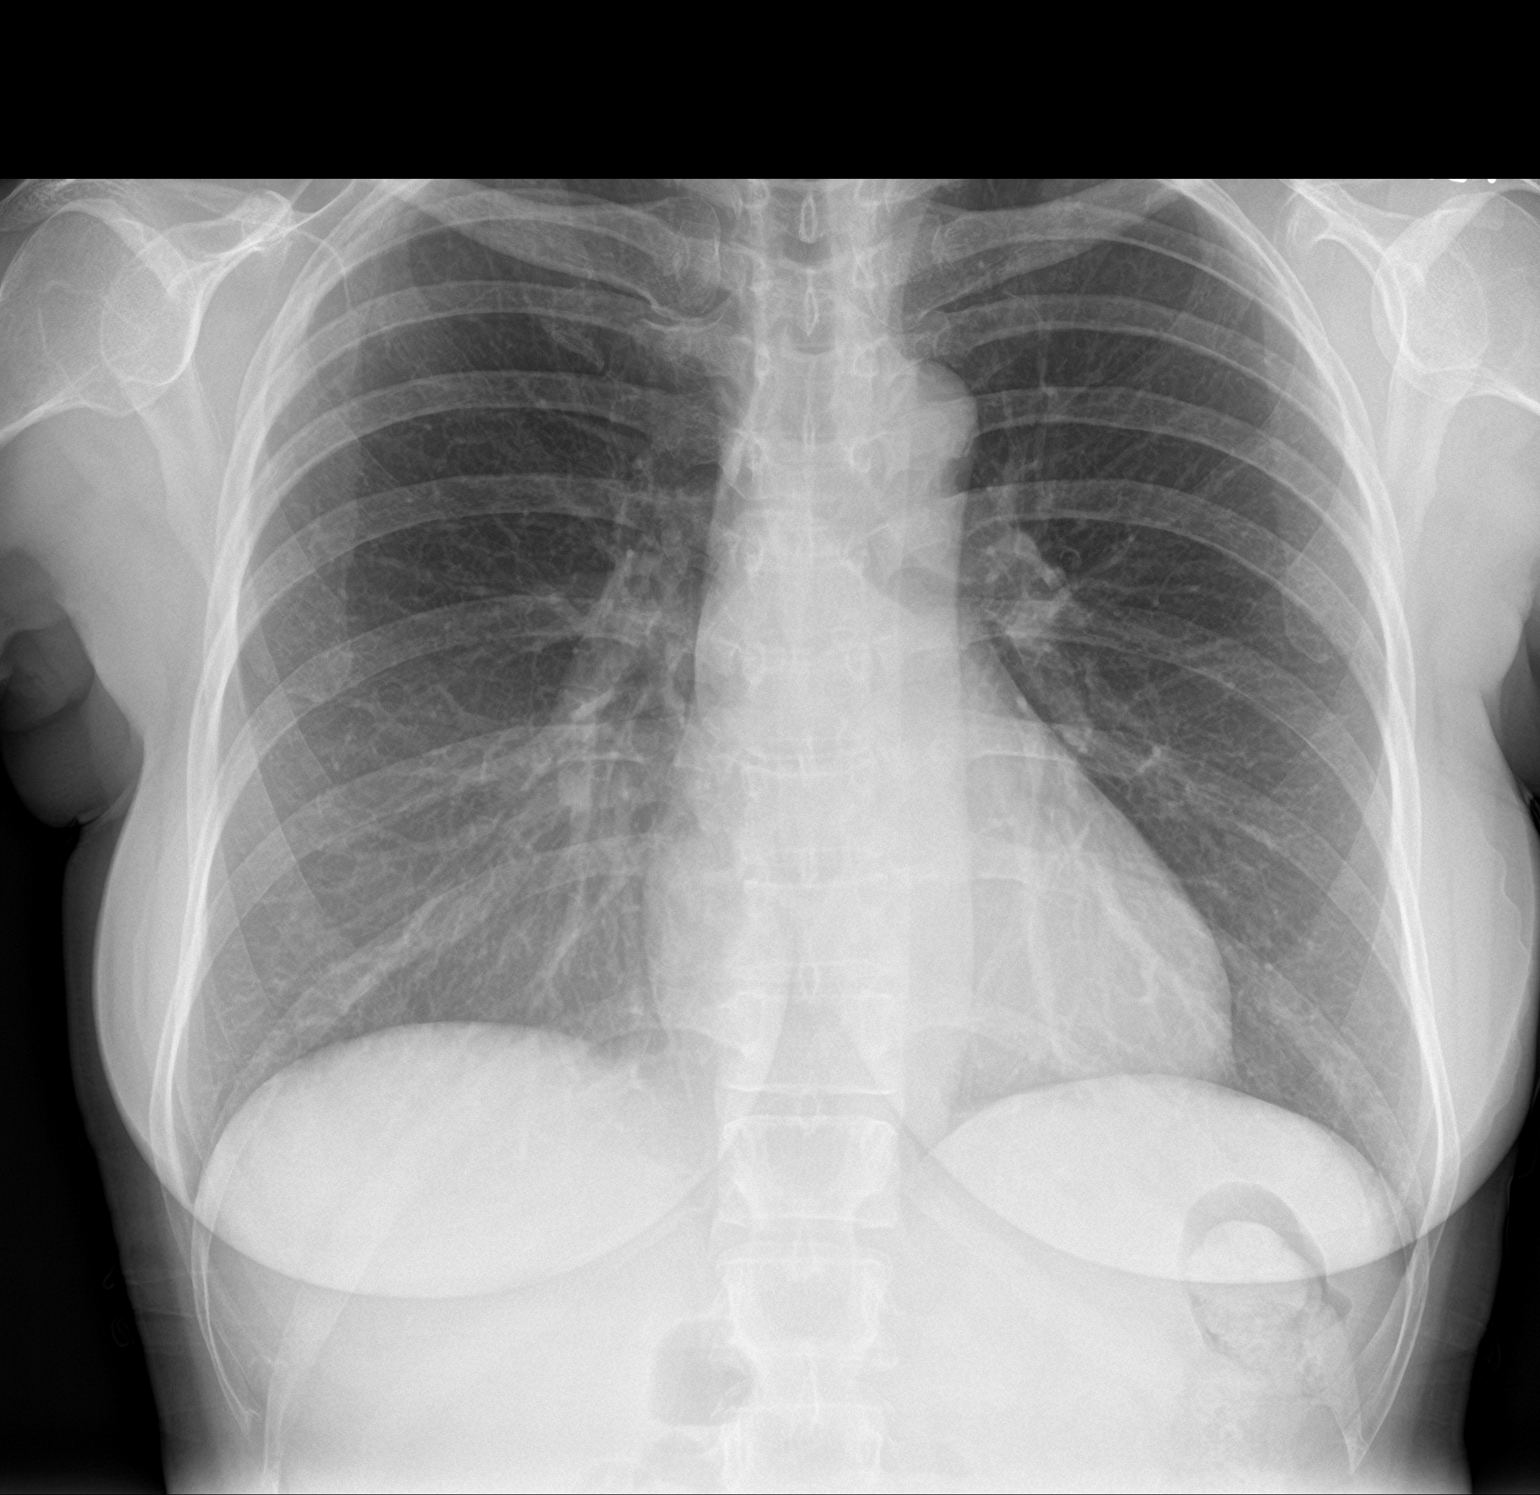

[chest lat]
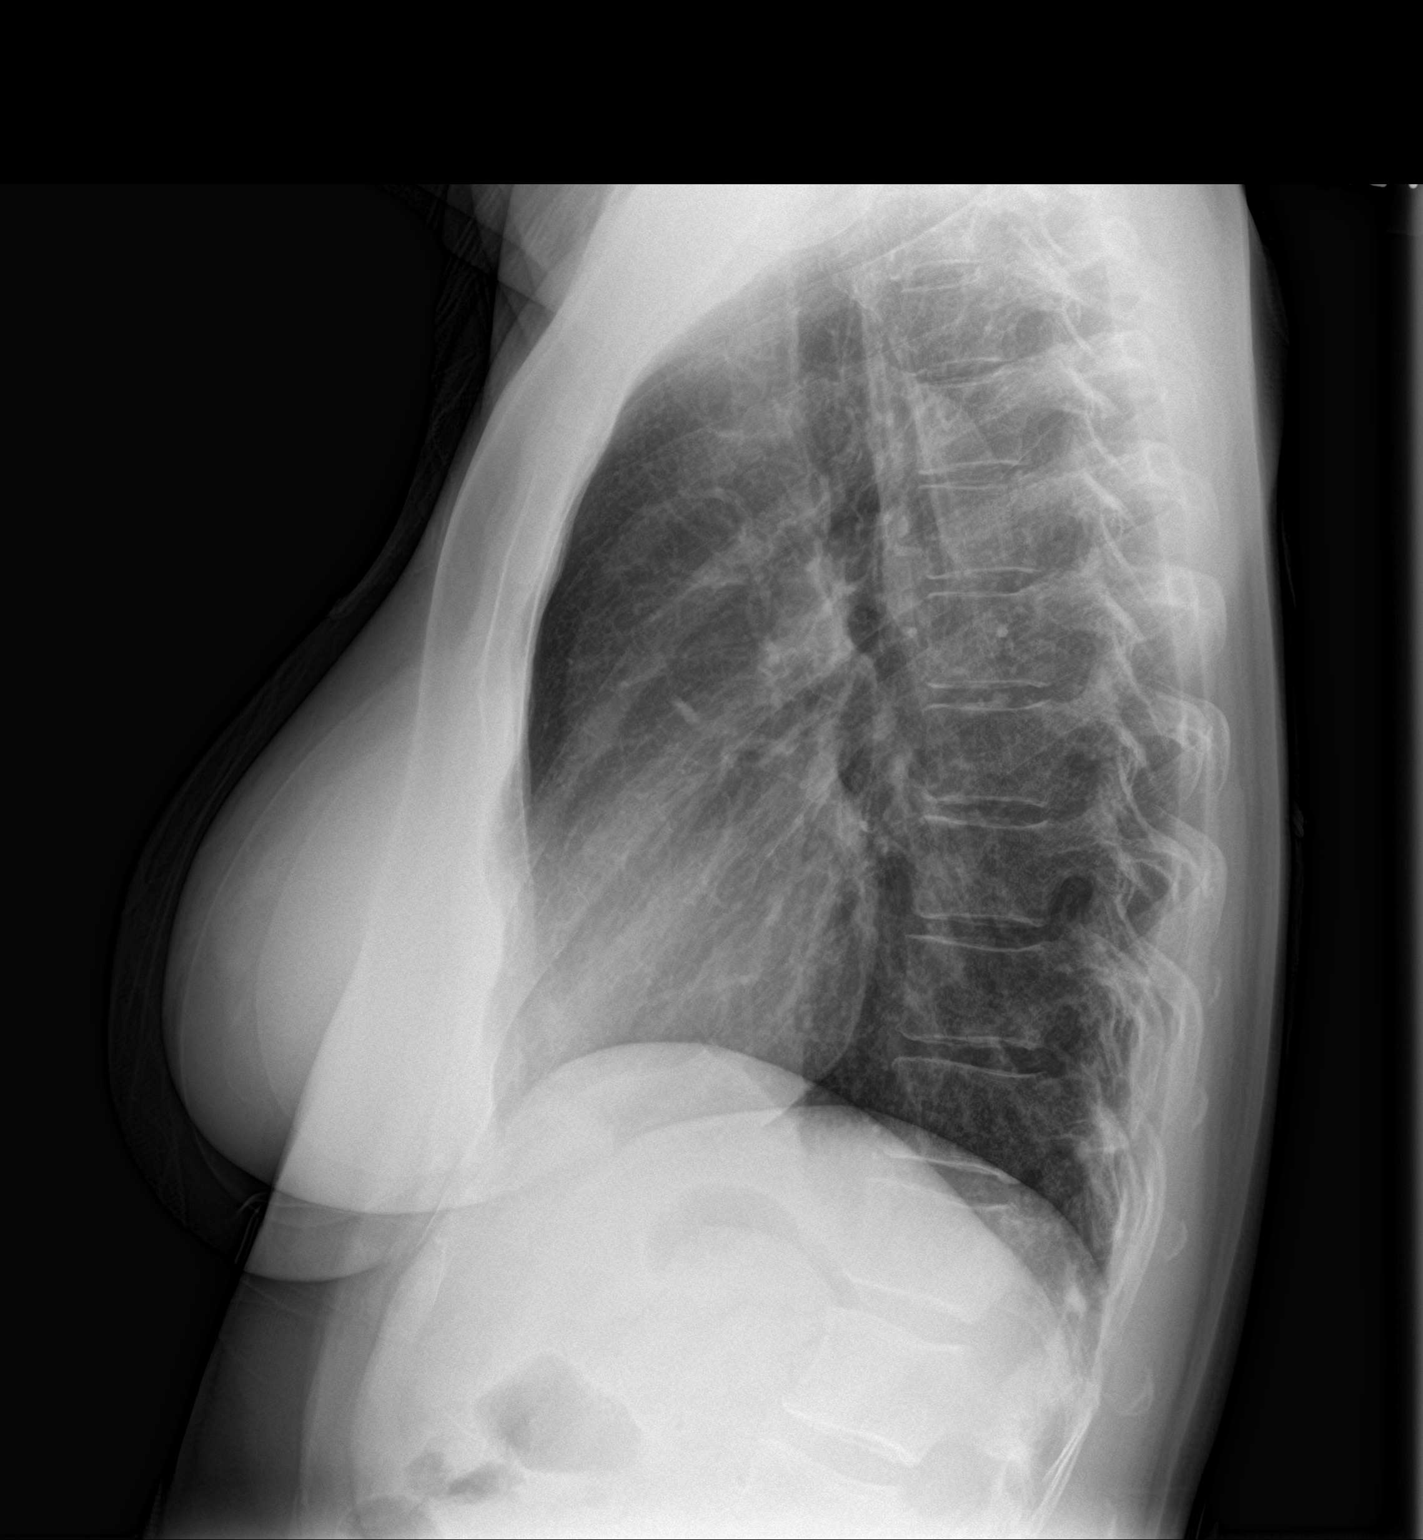

[2 of 2 positions shown; findings below may reference images not displayed]

FINDINGS: The cardiomediastinal silhouette is normal.

The lungs are clear, without focal consolidation or pulmonary edema.
There is no pleural effusion or pneumothorax.

The bones are unremarkable.
IMPRESSION: No radiographic evidence of acute cardiopulmonary process.

## 2023-04-29 ENCOUNTER — Ambulatory Visit (INDEPENDENT_AMBULATORY_CARE_PROVIDER_SITE_OTHER): Payer: No Typology Code available for payment source | Admitting: Obstetrics

## 2023-04-29 ENCOUNTER — Encounter: Payer: Self-pay | Admitting: Obstetrics

## 2023-04-29 VITALS — BP 103/68 | HR 68 | Ht 63.0 in | Wt 119.0 lb

## 2023-04-29 DIAGNOSIS — N952 Postmenopausal atrophic vaginitis: Secondary | ICD-10-CM | POA: Diagnosis not present

## 2023-04-29 DIAGNOSIS — N8110 Cystocele, unspecified: Secondary | ICD-10-CM | POA: Diagnosis not present

## 2023-04-29 DIAGNOSIS — N3946 Mixed incontinence: Secondary | ICD-10-CM | POA: Diagnosis not present

## 2023-04-29 DIAGNOSIS — K5900 Constipation, unspecified: Secondary | ICD-10-CM

## 2023-04-29 LAB — POCT URINALYSIS DIPSTICK
Bilirubin, UA: NEGATIVE
Blood, UA: NEGATIVE
Glucose, UA: NEGATIVE
Ketones, UA: NEGATIVE
Leukocytes, UA: NEGATIVE
Nitrite, UA: NEGATIVE
Protein, UA: NEGATIVE
Spec Grav, UA: 1.02 (ref 1.010–1.025)
Urobilinogen, UA: 0.2 U/dL
pH, UA: 7 (ref 5.0–8.0)

## 2023-04-29 MED ORDER — GEMTESA 75 MG PO TABS: 75.0000 mg | ORAL_TABLET | Freq: Every day | ORAL | 2 refills | Status: DC

## 2023-04-29 MED ORDER — GEMTESA 75 MG PO TABS: 75.0000 mg | ORAL_TABLET | Freq: Every day | ORAL | Status: DC

## 2023-04-29 MED ORDER — ESTRADIOL 0.1 MG/GM VA CREA: 0.5000 g | TOPICAL_CREAM | VAGINAL | 3 refills | Status: DC

## 2023-04-29 NOTE — Assessment & Plan Note (Signed)
-   For symptomatic vaginal atrophy options include lubrication with a water-based lubricant, personal hygiene measures and barrier protection against wetness, and estrogen replacement in the form of vaginal cream, vaginal tablets, or a time-released vaginal ring.   - currently on estradiol patch for HRT and prometrium - Rx estradiol low dose maintenance dose 1g 2x/week

## 2023-04-29 NOTE — Assessment & Plan Note (Signed)
-   POCT UA negative, bladder scan 46mL - urgency leads to higher leakage volume  - We discussed the symptoms of overactive bladder (OAB), which include urinary urgency, urinary frequency, nocturia, with or without urge incontinence.  While we do not know the exact etiology of OAB, several treatment options exist. We discussed management including behavioral therapy (decreasing bladder irritants, urge suppression strategies, timed voids, bladder retraining), physical therapy, medication; for refractory cases posterior tibial nerve stimulation, sacral neuromodulation, and intravesical botulinum toxin injection.  For anticholinergic medications, we discussed the potential side effects of anticholinergics including dry eyes, dry mouth, constipation, cognitive impairment and urinary retention. For Beta-3 agonist medication, we discussed the potential side effect of elevated blood pressure which is more likely to occur in individuals with uncontrolled hypertension. - samples and Rx provided for Gemtesa - encouraged fluid management, caffeine reduction  - instructions with handout reviewed for Kegel exercises - For treatment of stress urinary incontinence,  non-surgical options include expectant management, weight loss, physical therapy, as well as a pessary.  Surgical options include a midurethral sling, Burch urethropexy, and transurethral injection of a bulking agent. - start low dose vaginal estrogen at maintenance dosing

## 2023-04-29 NOTE — Assessment & Plan Note (Addendum)
-   asymptomatic, noted on exam - For treatment of pelvic organ prolapse, we discussed options for management including expectant management, conservative management, and surgical management, such as Kegels, a pessary, pelvic floor physical therapy, and specific surgical procedures.

## 2023-04-29 NOTE — Progress Notes (Signed)
New Patient Evaluation and Consultation  Referring Provider: Allwardt, Crist Infante, PA-C PCP: Bary Leriche, PA-C Date of Service: 04/29/2023  SUBJECTIVE Chief Complaint: New Patient (Initial Visit) Anna Young is a 62 y.o. female here today for urinary incontinence.)  History of Present Illness: Anna Young is a 63 y.o. White or Caucasian female seen in consultation at the request of PA Allwardt for evaluation of stress urinary incontinence.    Sister underwent laser procedure a few years ago for urinary leakage with relief Reports symptoms since pregnancy On prometrium Vivelle dot for HRT a few years ago Pelvic floor PT without relief around 5 years ago   Review of records significant for: Cervical degenerative joint disease, fibroids, hypothyroidism  Urinary Symptoms: Leaks urine with cough/ sneeze, laughing, exercise, lifting, with a full bladder, with movement to the bathroom, and with urgency Leaks 2-3 time(s) per week with activity Leaks 2-3x/day with urgency Denies pad use, managed with 2 underwear changes/day for urgency leakage  Patient is bothered by UI symptoms.  Day time voids 6-7.  Nocturia: 1-2 times per night to void. Voiding dysfunction:  empties bladder well.  Patient does not use a catheter to empty bladder.  When urinating, patient feels she has no difficulties Drinks: 64oz water per day  UTIs:  0  UTI's in the last year.   Denies history of blood in urine, kidney or bladder stones, pyelonephritis, bladder cancer, and kidney cancer No results found for the last 90 days.   Pelvic Organ Prolapse Symptoms:                  Patient Denies a feeling of a bulge the vaginal area.   Bowel Symptom: Bowel movements: 1-2 time(s) per week with history of constipation on motegrity PRN 1x/week, started 5 years ago, uses squatting position Stool consistency: soft  Straining: no.  Splinting: yes.  Incomplete evacuation: yes.  Patient Denies  accidental bowel leakage / fecal incontinence Bowel regimen: fiber and motegrity , metamucil 1 teasppon PRN Last colonoscopy: Date 12/18/20 by Dr. Loreta Ave, Results normal and due in 2027 due to prior history of tubular adeonma removed in 2013 [facility]          Current Care Gaps     Colonoscopy (Every 5 Years) Overdue since 04/16/2022    04/16/2017  [facility]   Only the first 1 history entries have been loaded, but more history exists.                Sexual Function Sexually active: yes.  Sexual orientation: Straight Pain with sex: No  Pelvic Pain Denies pelvic pain  Past Medical History:  Past Medical History:  Diagnosis Date   Abnormal uterine bleeding (AUB)    Cervical osteoarthritis    Neck   Endometrial polyp    Family history of breast cancer    Family history of prostate cancer    Fibroids, intramural 2014   Frozen shoulder, right    Hypothyroidism    Originally Dx by Endocrinology in 2009   Migraines    PONV (postoperative nausea and vomiting)    Urinary incontinence, stress    Wears glasses    Reading     Past Surgical History:   Past Surgical History:  Procedure Laterality Date   BREAST ENHANCEMENT SURGERY     COLONOSCOPY  04/2017   DILATATION & CURETTAGE/HYSTEROSCOPY WITH MYOSURE N/A 02/23/2018   Procedure: DILATATION & CURETTAGE/HYSTEROSCOPY WITH MYOSURE;  Surgeon: Romualdo Bolk, MD;  Location:  [facility];  Service: Gynecology;  Laterality: N/A;   FACIAL COSMETIC SURGERY     NASAL SEPTUM SURGERY     WISDOM TOOTH EXTRACTION       Past OB/GYN History: OB History  Gravida Para Term Preterm AB Living  3 3 3  0 0 3  SAB IAB Ectopic Multiple Live Births  0 0 0 0 3    # Outcome Date GA Lbr Len/2nd Weight Sex Type Anes PTL Lv  3 Term      Vag-Spont   LIV  2 Term      Vag-Spont   LIV  1 Term      Vag-Spont   LIV    Vaginal deliveries: 3, largest infant 7lb 11oz, episiotomy. Forceps/ Vacuum deliveries: 0,  Cesarean section: 0 Menopausal: Yes, at age 72, Denies vaginal bleeding since menopause Reports prior vaginal bleeding with hormone pellet at 62yo Contraception: n/a, husband s/p vasectomy  Last pap smear.  Any history of abnormal pap smears: no.    Component Value Date/Time   DIAGPAP  12/21/2019 1657    - Negative for intraepithelial lesion or malignancy (NILM)   DIAGPAP  02/12/2018 0000    NEGATIVE FOR INTRAEPITHELIAL LESIONS OR MALIGNANCY. BENIGN REACTIVE/REPARATIVE CHANGES.   HPVHIGH Negative 12/21/2019 1657   ADEQPAP  12/21/2019 1657    Satisfactory for evaluation; transformation zone component PRESENT.   ADEQPAP  02/12/2018 0000    Satisfactory for evaluation  endocervical/transformation zone component PRESENT.    Medications: Patient has a current medication list which includes the following prescription(s): sunscreens, [START ON 05/01/2023] estradiol, estradiol, levothyroxine, progesterone, gemtesa, gemtesa, liothyronine, motegrity, and multiple vitamin.   Allergies: Patient is allergic to gluten meal and lactose intolerance (gi).   Social History:  Social History   Tobacco Use   Smoking status: Never   Smokeless tobacco: Never  Vaping Use   Vaping status: Never Used  Substance Use Topics   Alcohol use: No    Alcohol/week: 0.0 standard drinks of alcohol   Drug use: No    Relationship status: married Patient lives with her husband.   Patient is employed as a Customer service manager. Regular exercise: Yes: yoga and walking History of abuse: No  Family History:   Family History  Problem Relation Age of Onset   Diabetes Mother    Colon polyps Father    Heart disease Father 38   Stroke Father    Breast cancer Maternal Grandmother 93   Stroke Maternal Grandfather    Other Paternal Grandmother        housefire   Breast cancer Maternal Aunt 24   Cancer Paternal Aunt 64       NOS   Colon cancer Neg Hx    Ovarian cancer Neg Hx    Uterine cancer Neg Hx    Bladder  Cancer Neg Hx      Review of Systems: Review of Systems  Constitutional:  Positive for malaise/fatigue. Negative for fever and weight loss.  Respiratory:  Negative for cough, shortness of breath and wheezing.   Cardiovascular:  Negative for chest pain, palpitations and leg swelling.  Gastrointestinal:  Positive for constipation. Negative for abdominal pain and blood in stool.  Genitourinary:  Positive for urgency. Negative for dysuria, frequency and hematuria.       Leakage  Skin:  Negative for rash.  Neurological:  Negative for dizziness, weakness and headaches.  Endo/Heme/Allergies:  Does not bruise/bleed easily.  Psychiatric/Behavioral:  Negative for depression. The patient is not  nervous/anxious.      OBJECTIVE Physical Exam: Vitals:   04/29/23 0933  BP: 103/68  Pulse: 68  Weight: 119 lb (54 kg)  Height: 5\' 3"  (1.6 m)   Physical Exam Constitutional:      General: She is not in acute distress.    Appearance: Normal appearance.  Genitourinary:     Bladder and urethral meatus normal.     No lesions in the vagina.     Right Labia: No rash, tenderness, lesions, skin changes or Bartholin's cyst.    Left Labia: No tenderness, lesions, skin changes, Bartholin's cyst or rash.    No vaginal discharge, erythema, tenderness, bleeding, ulceration or granulation tissue.     Anterior vaginal prolapse present.    Mild vaginal atrophy present.     Right Adnexa: not tender, not full and no mass present.    Left Adnexa: not tender, not full and no mass present.    No cervical motion tenderness, discharge, friability, lesion, polyp or nabothian cyst.     Uterus is not enlarged, fixed, tender or irregular.     No uterine mass detected.    Urethral meatus caruncle not present.    No urethral prolapse, tenderness, mass, hypermobility, discharge or stress urinary incontinence with cough stress test present.     Bladder is not tender, urgency on palpation not present and masses not present.       Pelvic Floor: Levator muscle strength is 4/5.    Levator ani not tender, obturator internus not tender, no asymmetrical contractions present and no pelvic spasms present.    Anal wink present and BC reflex present. Cardiovascular:     Rate and Rhythm: Normal rate.  Pulmonary:     Effort: Pulmonary effort is normal. No respiratory distress.  Abdominal:     General: There is no distension.     Palpations: Abdomen is soft. There is no mass.     Tenderness: There is no abdominal tenderness.     Hernia: No hernia is present.  Neurological:     Mental Status: She is alert.  Vitals reviewed. Exam conducted with a chaperone present.      POP-Q:   POP-Q  -1                                            Aa   -1                                           Ba  -6                                              C   2                                            Gh  2  Pb  8                                            tvl   -1                                            Ap  -1                                            Bp  -7                                              D    Post-Void Residual (PVR) by Bladder Scan: In order to evaluate bladder emptying, we discussed obtaining a postvoid residual and patient agreed to this procedure.  Procedure: The ultrasound unit was placed on the patient's abdomen in the suprapubic region after the patient had voided.    Post Void Residual - 04/29/23 0948       Post Void Residual   Post Void Residual 46 mL              Laboratory Results: Lab Results  Component Value Date   COLORU yellow 04/29/2023   CLARITYU clear 04/29/2023   GLUCOSEUR Negative 04/29/2023   BILIRUBINUR negative 04/29/2023   KETONESU negative 04/29/2023   SPECGRAV 1.020 04/29/2023   RBCUR negative 04/29/2023   PHUR 7.0 04/29/2023   PROTEINUR Negative 04/29/2023   UROBILINOGEN 0.2 04/29/2023   LEUKOCYTESUR Negative  04/29/2023    Lab Results  Component Value Date   CREATININE 0.71 02/28/2023   CREATININE 0.72 03/06/2021   CREATININE 0.67 12/21/2019    Lab Results  Component Value Date   HGBA1C 5.6 02/28/2023    Lab Results  Component Value Date   HGB 13.5 02/28/2023     ASSESSMENT AND PLAN Ms. Zapalac is a 62 y.o. with:  1. Mixed stress and urge urinary incontinence   2. Constipation, unspecified constipation type   3. Vaginal atrophy   4. Pelvic organ prolapse quantification stage 2 cystocele     Mixed stress and urge urinary incontinence Assessment & Plan: - POCT UA negative, bladder scan 46mL - urgency leads to higher leakage volume  - We discussed the symptoms of overactive bladder (OAB), which include urinary urgency, urinary frequency, nocturia, with or without urge incontinence.  While we do not know the exact etiology of OAB, several treatment options exist. We discussed management including behavioral therapy (decreasing bladder irritants, urge suppression strategies, timed voids, bladder retraining), physical therapy, medication; for refractory cases posterior tibial nerve stimulation, sacral neuromodulation, and intravesical botulinum toxin injection.  For anticholinergic medications, we discussed the potential side effects of anticholinergics including dry eyes, dry mouth, constipation, cognitive impairment and urinary retention. For Beta-3 agonist medication, we discussed the potential side effect of elevated blood pressure which is more likely to occur in individuals with uncontrolled hypertension. - samples and Rx provided for Gemtesa - encouraged fluid management, caffeine reduction  - instructions with handout reviewed for Kegel exercises -  For treatment of stress urinary incontinence,  non-surgical options include expectant management, weight loss, physical therapy, as well as a pessary.  Surgical options include a midurethral sling, Burch urethropexy, and transurethral  injection of a bulking agent. - start low dose vaginal estrogen at maintenance dosing  Orders: -     POCT urinalysis dipstick -     Gemtesa; Take 1 tablet (75 mg total) by mouth daily.  Dispense: 30 tablet; Refill: 2 -     Gemtesa; Take 1 tablet (75 mg total) by mouth daily.  Constipation, unspecified constipation type Assessment & Plan: - For constipation, we reviewed the importance of a better bowel regimen.  We also discussed the importance of avoiding chronic straining, as it can exacerbate her pelvic floor symptoms; we discussed treating constipation and straining prior to surgery, as postoperative straining can lead to damage to the repair and recurrence of symptoms. We discussed initiating therapy with increasing fluid intake, fiber supplementation, stool softeners, and laxatives such as miralax.  - discussed impact of constipation on pelvic floor symptoms - continue squatting position for bowel movement and resume daily miralax   Vaginal atrophy Assessment & Plan: - For symptomatic vaginal atrophy options include lubrication with a water-based lubricant, personal hygiene measures and barrier protection against wetness, and estrogen replacement in the form of vaginal cream, vaginal tablets, or a time-released vaginal ring.   - currently on estradiol patch for HRT and prometrium - Rx estradiol low dose maintenance dose 1g 2x/week   Pelvic organ prolapse quantification stage 2 cystocele Assessment & Plan: - For treatment of pelvic organ prolapse, we discussed options for management including expectant management, conservative management, and surgical management, such as Kegels, a pessary, pelvic floor physical therapy, and specific surgical procedures.    Other orders -     Estradiol; Place 0.5 g vaginally 2 (two) times a week. Place 0.5g twice a week  Dispense: 30 g; Refill: 3   Time spent: I spent 65 minutes dedicated to the care of this patient on the date of this encounter to  include pre-visit review of records, face-to-face time with the patient discussing mixed urinary incontinence, vaginal atrophy, constipation, asymptomatic pelvic organ prolapse, and post visit documentation and ordering medication/ testing.    Loleta Chance, MD

## 2023-04-29 NOTE — Patient Instructions (Signed)
We discussed the symptoms of overactive bladder (OAB), which include urinary urgency, urinary frequency, night-time urination, with or without urge incontinence.  We discussed management including behavioral therapy (decreasing bladder irritants by following a bladder diet, urge suppression strategies, timed voids, bladder retraining), physical therapy, medication; and for refractory cases posterior tibial nerve stimulation, sacral neuromodulation, and intravesical botulinum toxin injection.   For Beta-3 agonist medication, we discussed the potential side effect of elevated blood pressure which is more likely to occur in individuals with uncontrolled hypertension. You were given samples for Gemtesa 75  mg.  It can take a month to start working so give it time, but if you have bothersome side effects call sooner and we can try a different medication.  Call us if you have trouble filling the prescription or if it's not covered by your insurance.  For treatment of stress urinary incontinence, which is leakage with physical activity/movement/strainging/coughing, we discussed expectant management versus nonsurgical options versus surgery. Nonsurgical options include weight loss, physical therapy, as well as a pessary.  Surgical options include a midurethral sling, which is a synthetic mesh sling that acts like a hammock under the urethra to prevent leakage of urine, a Burch urethropexy, and transurethral injection of a bulking agent.   For vaginal atrophy (thinning of the vaginal tissue that can cause dryness and burning) and UTI prevention we discussed estrogen replacement in the form of vaginal cream.   Start vaginal estrogen therapy 2 times weekly at night. This can be placed with your finger or an applicator inside the vagina and around the urethra.  Please let us know if the prescription is too expensive and we can look for alternative options.   Is vaginal estrogen therapy safe for me? Vaginal estrogen  preparations act on the vaginal skin, and only a very tiny amount is absorbed into the bloodstream (0.01%).  They work in a similar way to hand or face cream.  There is minimal absorption and they are therefore perfectly safe. If you have had breast cancer and have persistent troublesome symptoms which aren't settling with vaginal moisturisers and lubricants, local estrogen treatment may be a possibility, but consultation with your oncologist should take place first.   Constipation: Our goal is to achieve formed bowel movements daily or every-other-day.  You may need to try different combinations of the following options to find what works best for you - everybody's body works differently so feel free to adjust the dosages as needed.  Some options to help maintain bowel health include:  Dietary changes (more leafy greens, vegetables and fruits; less processed foods) Fiber supplementation (Benefiber, FiberCon, Metamucil or Psyllium). Start slow and increase gradually to full dose. Over-the-counter agents such as: stool softeners (Docusate or Colace) and/or laxatives (Miralax, milk of magnesia)  "Power Pudding" is a natural mixture that may help your constipation.  To make blend 1 cup applesauce, 1 cup wheat bran, and 3/4 cup prune juice, refrigerate and then take 1 tablespoon daily with a large glass of water as needed.   Women should try to eat at least 21 to 25 grams of fiber a day, while men should aim for 30 to 38 grams a day. You can add fiber to your diet with food or a fiber supplement such as psyllium (metamucil), benefiber, or fibercon.   Here's a look at how much dietary fiber is found in some common foods. When buying packaged foods, check the Nutrition Facts label for fiber content. It can vary among brands.  Fruits Serving size Total fiber (grams)*  Raspberries 1 cup 8.0  Pear 1 medium 5.5  Apple, with skin 1 medium 4.5  Banana 1 medium 3.0  Orange 1 medium 3.0  Strawberries 1 cup 3.0    Vegetables Serving size Total fiber (grams)*  Green peas, boiled 1 cup 9.0  Broccoli, boiled 1 cup chopped 5.0  Turnip greens, boiled 1 cup 5.0  Brussels sprouts, boiled 1 cup 4.0  Potato, with skin, baked 1 medium 4.0  Sweet corn, boiled 1 cup 3.5  Cauliflower, raw 1 cup chopped 2.0  Carrot, raw 1 medium 1.5   Grains Serving size Total fiber (grams)*  Spaghetti, whole-wheat, cooked 1 cup 6.0  Barley, pearled, cooked 1 cup 6.0  Bran flakes 3/4 cup 5.5  Quinoa, cooked 1 cup 5.0  Oat bran muffin 1 medium 5.0  Oatmeal, instant, cooked 1 cup 5.0  Popcorn, air-popped 3 cups 3.5  Brown rice, cooked 1 cup 3.5  Bread, whole-wheat 1 slice 2.0  Bread, rye 1 slice 2.0   Legumes, nuts and seeds Serving size Total fiber (grams)*  Split peas, boiled 1 cup 16.0  Lentils, boiled 1 cup 15.5  Black beans, boiled 1 cup 15.0  Baked beans, canned 1 cup 10.0  Chia seeds 1 ounce 10.0  Almonds 1 ounce (23 nuts) 3.5  Pistachios 1 ounce (49 nuts) 3.0  Sunflower kernels 1 ounce 3.0  *Rounded to nearest 0.5 gram. Source: [employer] for [employer]	[place]

## 2023-04-29 NOTE — Assessment & Plan Note (Signed)
-   For constipation, we reviewed the importance of a better bowel regimen.  We also discussed the importance of avoiding chronic straining, as it can exacerbate her pelvic floor symptoms; we discussed treating constipation and straining prior to surgery, as postoperative straining can lead to damage to the repair and recurrence of symptoms. We discussed initiating therapy with increasing fluid intake, fiber supplementation, stool softeners, and laxatives such as miralax.  - discussed impact of constipation on pelvic floor symptoms - continue squatting position for bowel movement and resume daily miralax

## 2023-05-03 ENCOUNTER — Other Ambulatory Visit: Payer: Self-pay | Admitting: Obstetrics

## 2023-05-03 DIAGNOSIS — N3946 Mixed incontinence: Secondary | ICD-10-CM

## 2023-05-12 NOTE — Telephone Encounter (Signed)
Rx changed to Trospium per insurance recommendation.  If not covered by insurance or lack of relief, can Rx mirabegron if patient can monitor blood pressure.

## 2023-10-28 ENCOUNTER — Other Ambulatory Visit: Payer: Self-pay | Admitting: Obstetrics and Gynecology

## 2023-10-28 DIAGNOSIS — N6310 Unspecified lump in the right breast, unspecified quadrant: Secondary | ICD-10-CM

## 2023-10-31 ENCOUNTER — Ambulatory Visit
Admission: RE | Admit: 2023-10-31 | Discharge: 2023-10-31 | Disposition: A | Discharge status: Home / Self Care | Source: Ambulatory Visit | Attending: Obstetrics and Gynecology | Admitting: Obstetrics and Gynecology

## 2023-10-31 DIAGNOSIS — N6310 Unspecified lump in the right breast, unspecified quadrant: Secondary | ICD-10-CM

## 2023-11-03 NOTE — Progress Notes (Signed)
 63 y.o. G74P3003 female with hx of bilateral breast implants here for multiple complaints. Married. Works in Research [employer], political party. Last annual exam 2023.  Patient's last menstrual period was 06/13/2018.   She reports lump in right breast noted last Monday.  Nontender.  No nipple discharge or skin changes.  Diagnostic MMG ordered by outside GYN. On MMG was noted as a cyst, chronic.   She also wants to discuss menopausal symptoms. She was previously prescribed systemic HRT by [facility] (local [facility]).  However she completed genetic testing last year that showed a COMT mutation, suggestive of estrogen imbalance. She explains that she was having significant side effects using estrogen pellets and estrogen patches. She recently discontinued her HRT when she found the breast lump. She is now having hot flashes, vaginal dryness. She would like to discuss alternative therapies. She is also using an estrogen based cream prescribed by [facility]. She reports poor insurance coverage through work. Husband recently retired.  Birth control: Postmenopausal Last mammogram: 10/31/23 BIRADS 2, density c Sexually active: Yes    GYN HISTORY: No significant history  OB History  Gravida Para Term Preterm AB Living  3 3 3  0 0 3  SAB IAB Ectopic Multiple Live Births  0 0 0 0 3    # Outcome Date GA Lbr Len/2nd Weight Sex Type Anes PTL Lv  3 Term      Vag-Spont   LIV  2 Term      Vag-Spont   LIV  1 Term      Vag-Spont   LIV   Past Medical History:  Diagnosis Date   Abnormal uterine bleeding (AUB)    Cervical osteoarthritis    Neck   Endometrial polyp    Family history of breast cancer    Family history of prostate cancer    Fibroids, intramural 2014   Frozen shoulder, right    Hypothyroidism    Originally Dx by Endocrinology in 2009   Migraines    PONV (postoperative nausea and vomiting)    Urinary incontinence, stress    Wears glasses    Reading   Past Surgical History:  Procedure  Laterality Date   BREAST ENHANCEMENT SURGERY     COLONOSCOPY  04/2017   DILATATION & CURETTAGE/HYSTEROSCOPY WITH MYOSURE N/A 02/23/2018   Procedure: DILATATION & CURETTAGE/HYSTEROSCOPY WITH MYOSURE;  Surgeon: Jannis Kate Norris, MD;  Location: [facility];  Service: Gynecology;  Laterality: N/A;   FACIAL COSMETIC SURGERY     NASAL SEPTUM SURGERY     WISDOM TOOTH EXTRACTION     Current Outpatient Medications on File Prior to Visit  Medication Sig Dispense Refill   [employer] SUNSCREEN SPF 30 EX apply topically to face and body daily for 30     levothyroxine  (SYNTHROID ) 75 MCG tablet Synthroid      liothyronine (CYTOMEL) 5 MCG tablet Take 10 mcg by mouth every morning.     MOTEGRITY 2 MG TABS Take 1 tablet by mouth daily. As needed     Multiple Vitamin (MULTI-VITAMINS PO) Take by mouth daily.     estradiol  (ESTRACE ) 0.1 MG/GM vaginal cream Place 0.5 g vaginally 2 (two) times a week. Place 0.5g twice a week (Patient not taking: Reported on 11/04/2023) 30 g 3   estradiol  (VIVELLE -DOT) 0.025 MG/24HR Place onto the skin. (Patient not taking: Reported on 11/04/2023)     progesterone  (PROMETRIUM ) 100 MG capsule SMARTSIG:1-2 Capsule(s) By Mouth Every Evening (Patient not taking: Reported on 11/04/2023)     Trospium  Chloride 60 MG CP24 Take 1 capsule (60 mg total) by mouth daily. (Patient not taking: Reported on 11/04/2023) 30 capsule 2   No current facility-administered medications on file prior to visit.   Allergies  Allergen Reactions   Gluten Meal Other (See Comments)    Abdominal pain   Lactose Intolerance (Gi)       PE Today's Vitals   11/04/23 0905  BP: 98/60  Pulse: 65  Temp: 98 F (36.7 C)  TempSrc: Oral  SpO2: 98%  Weight: 115 lb (52.2 kg)   Body mass index is 20.37 kg/m.  Physical Exam Vitals reviewed.  Constitutional:      General: She is not in acute distress.    Appearance: Normal appearance.  HENT:     Head: Normocephalic and atraumatic.     Nose: Nose  normal.   Eyes:     Extraocular Movements: Extraocular movements intact.     Conjunctiva/sclera: Conjunctivae normal.   Pulmonary:     Effort: Pulmonary effort is normal.  Chest:  Breasts:    Right: Normal. No mass, nipple discharge, skin change or tenderness.     Left: Normal. No mass, nipple discharge, skin change or tenderness.      Comments: Declined chaperone +implants (25ys old)    Musculoskeletal:        General: Normal range of motion.     Cervical back: Normal range of motion.  Lymphadenopathy:     Upper Body:     Right upper body: No axillary adenopathy.     Left upper body: No axillary adenopathy.   Neurological:     General: No focal deficit present.     Mental Status: She is alert.   Psychiatric:        Mood and Affect: Mood normal.        Behavior: Behavior normal.      Assessment and Plan:        Mass of upper outer quadrant of right breast Patient presents with self palpated, nontender, mass x 1-1/2 weeks. Benign diagnostic mammogram completed.  Last screening mammogram in 2023.  Patient completed whole body heat scan in 2024. Benign findings on exam today Recommend repeat mammogram in 1 year. F/u for any changes  Perimenopause Discussed nonhormonal options for VMS, including SSRIs, veozah, gabapetin. Veozah is contraindicated in patient with liver dysfunction and required q3 months CMP side effects include abdominal pain, diarrhea, back pain, and insomnia. Considering Veozah, wants to try estroven first.  Genitourinary syndrome of menopause Given COMT mutation, patient prefers intrarosa over PV estradiol . -     Intrarosa; Place 1 tablet vaginally at bedtime.  Dispense: 28 each; Refill: 11  39 min  total time was spent for this patient encounter, including preparation, face-to-face counseling with the patient, coordination of care, and documentation of the encounter.   Vera LULLA Pa, MD

## 2023-11-04 ENCOUNTER — Ambulatory Visit: Admitting: Obstetrics and Gynecology

## 2023-11-04 ENCOUNTER — Other Ambulatory Visit: Payer: Self-pay | Admitting: Obstetrics and Gynecology

## 2023-11-04 VITALS — BP 98/60 | HR 65 | Temp 98.0°F | Wt 115.0 lb

## 2023-11-04 DIAGNOSIS — N958 Other specified menopausal and perimenopausal disorders: Secondary | ICD-10-CM

## 2023-11-04 DIAGNOSIS — N951 Menopausal and female climacteric states: Secondary | ICD-10-CM

## 2023-11-04 DIAGNOSIS — N6311 Unspecified lump in the right breast, upper outer quadrant: Secondary | ICD-10-CM | POA: Diagnosis not present

## 2023-11-04 MED ORDER — INTRAROSA 6.5 MG VA INST: 1.0000 | VAGINAL_INSERT | Freq: Every evening | VAGINAL | 11 refills | Status: DC

## 2023-11-04 NOTE — Patient Instructions (Signed)
 Considering applying aquaphor or coconut oil to the vulva after showers. You could also using daily vaginal moisturizers by brands like Good Clean Love and Ah! Yes.

## 2023-11-11 ENCOUNTER — Telehealth: Payer: Self-pay

## 2023-11-11 NOTE — Telephone Encounter (Signed)
 A prior authorization has been submitted for Intrarosa  6.5MG  inserts  Key: [id number] [facility] is reviewing your PA request. Typically an electronic response will be received within 24-72 hours.

## 2023-12-09 NOTE — Telephone Encounter (Signed)
 Update on Prior Authorization: This request has received an Unfavorable outcome.  Denied on July 2 by [facility] 2017 NCPDP Request Reference Number: [id number].   INTRAROSA  SUP 6.5MG  is denied for not meeting the prior authorization requirement(s).  Denial Notice An eAppeal may be available. View the bottom of the request to see if an eAppeal is available along with any additional information provided by [facility].  On behalf of [employer], [facility] is responsible for reviewing pharmacy services provided to [employer].   We received a request from your prescriber for coverage of Intrarosa  Sup 6.5mg . We reviewed all of the information you and/or your doctor sent to us  and sent the information to an appropriate physician specialist if needed. Unfortunately, we must deny coverage for Intrarosa .  Why was my request denied?  This request was denied because you did not meet the following requirements:  The requested medication is not covered because it is not on the listing or formulary of approved drugs for your plan benefit. Please discuss alternative drug therapy with your doctor. The request for coverage for INTRAROSA  SUP 6.5MG , use as directed (28 per 28 days), is denied. This decision is based on health plan criteria for INTRAROSA  SUP 6.5MG . This medicine is covered only if: You have a diagnosis of moderate to severe dyspareunia. The information provided does not show that you meet the criteria listed above.  The reason(s) [facility] did not approve this medication can be found above. This denial is based on our Intrarosa  drug coverage policy, in addition to any supplementary information you or your prescriber may have submitted.  Benefits for services provided are subject to all plan provisions, including, but not limited to deductibles, copayments, coinsurance, limitations and exclusions. For example, please note the plan you selected has a  Preexisting Conditions exclusion provision. You and your medical provider make the final decisions about your medical care, but due to the provisions of your plan, you may be required to pay for services received.   Benefit determinations are made based on the terms and conditions of your plan at the time services are provided.  How can I obtain the material(s) used to review this request?  You may request, free of charge, a copy of the drug coverage policy, actual benefit provision, guideline, protocol or other information that factored into the decision, including the diagnosis code and the treatment code and their corresponding meanings, by calling us  at [phone], or by writing to the address below:  [facility] c/o Prior Authorization Guidelines [address] [place], [place] [address]  Please note that this decision only affects whether your prescription plan will pay for this medication. Only you and your prescriber can decide what is best for you and your treatment. You may still buy this medication (at full cost) at your local pharmacy.  What if my prescriber wants to discuss this decision with a peer?  Your prescriber may request to discuss this decision with a reviewing physician or other appropriate reviewer by contacting us  at  [phone].  APPEAL/GRIEVANCE PROCESS  What if I don't agree with this decision?  You, your authorized representative (someone you name, in writing, to act for you, such as a family member, an attorney, or a friend), or someone acting on your behalf have the right to appeal any decision that denies payment for an item or service (in whole or in part). You may also submit written comments, documents or other information relevant to the  appeals.  How do I file an appeal?  You, your authorized representative, or someone acting on your behalf have the right to appeal this medication coverage decision. You can get appeals information, including  independent appeal rights, if applicable, by calling the toll-free member number listed on your health plan ID card. You can also review your plan's prescription drug benefit information for more detailed information regarding the appeal process.  To file an appeal, please send any written comments, documents or other relevant documentation with your appeal to the address listed below:  Recruitment consultant [address] [address] [place], [place] [address] Fax: [phone]  How long does the appeals process take?  If you proceed with the appeals process, [employer] will review the denial decision and provide you with a written determination within 30 calendar days of receiving your appeal.  If you are not satisfied with the first level appeal decision, you have the right to request a second level appeal.  Who reviewed this request?  This request has been reviewed by a physician or pharmacist, who is a Engineer, agricultural for [facility] on behalf of [employer], with the Designer, multimedia.  If you have any additional questions, please call us  toll-free at [phone]. Sincerely, [facility]

## 2024-03-02 NOTE — Progress Notes (Signed)
 63 y.o. G51P3003 female with hypothyroidism (PCP), hx of bilateral breast implants (~63yo), GSM here for annual exam. Married. Real estate agent. PCP: Allwardt, Mardy HERO, PA-C   Patient's last menstrual period was 06/13/2018.   At 11/04/23 appt, she noted: She was previously prescribed systemic HRT by [facility] (local [facility]).  However she completed genetic testing last year that showed a COMT mutation, suggestive of estrogen imbalance. She explains that she was having significant side effects using estrogen pellets and estrogen patches. She recently discontinued her HRT when she found the breast lump. She would like to discuss alternative therapies. She is now having hot flashes, vaginal dryness. She is also using an estrogen based cream prescribed by [facility]. She reports poor insurance coverage through work. Husband recently retired.  Discussed Estroven versus Veozah at last appointment.  She wanted to try Estroven, however she has not tried. She has questions about oxybutynin for VMS (has friends that are using it). We also discussed use of Intrarosa  versus vaginal estrogen for GSM.  Intrarosa  was not covered by insurance that she has already vaginal estrogen. VMS have improved since she started PV estradiol  2 wk ago. Urine sample provided: no  Abnormal bleeding: none Pelvic discharge or pain: none Breast mass, nipple discharge or skin changes : no changes since last appt  Sexually active: Yes Birth control: vasectomy  History of abnormal Pap:  yes many years ago  Last PAP:     Component Value Date/Time   DIAGPAP  12/21/2019 1657    - Negative for intraepithelial lesion or malignancy (NILM)   DIAGPAP  02/12/2018 0000    NEGATIVE FOR INTRAEPITHELIAL LESIONS OR MALIGNANCY. BENIGN REACTIVE/REPARATIVE CHANGES.   HPVHIGH Negative 12/21/2019 1657   ADEQPAP  12/21/2019 1657    Satisfactory for evaluation; transformation zone component PRESENT.   ADEQPAP  02/12/2018  0000    Satisfactory for evaluation  endocervical/transformation zone component PRESENT.   Last mammogram: 10/31/23 Birads 2 benign, density C; benign right breast cyst Last colonoscopy: 2023, q31yr, Dr Kristie DXA: none  Exercising: Not currently Smoker: No  Flowsheet Row Office Visit from 03/03/2024 in [facility]  PHQ-2 Total Score 0      GYN HISTORY: No sig hx  OB History  Gravida Para Term Preterm AB Living  3 3 3  0 0 3  SAB IAB Ectopic Multiple Live Births  0 0 0 0 3    # Outcome Date GA Lbr Len/2nd Weight Sex Type Anes PTL Lv  3 Term      Vag-Spont   LIV  2 Term      Vag-Spont   LIV  1 Term      Vag-Spont   LIV   Past Medical History:  Diagnosis Date   Abnormal uterine bleeding (AUB)    Cervical osteoarthritis    Neck   Endometrial polyp    Family history of breast cancer    Family history of prostate cancer    Fibroids, intramural 2014   Frozen shoulder, right    Hypothyroidism    Originally Dx by Endocrinology in 2009   Migraines    PONV (postoperative nausea and vomiting)    Urinary incontinence, stress    Wears glasses    Reading   Past Surgical History:  Procedure Laterality Date   BREAST ENHANCEMENT SURGERY     COLONOSCOPY  04/2017   DILATATION & CURETTAGE/HYSTEROSCOPY WITH MYOSURE N/A 02/23/2018   Procedure: DILATATION & CURETTAGE/HYSTEROSCOPY WITH MYOSURE;  Surgeon:  Jertson, Jill Evelyn, MD;  Location: [facility];  Service: Gynecology;  Laterality: N/A;   FACIAL COSMETIC SURGERY     NASAL SEPTUM SURGERY     WISDOM TOOTH EXTRACTION     Current Outpatient Medications on File Prior to Visit  Medication Sig Dispense Refill   [employer] SUNSCREEN SPF 30 EX apply topically to face and body daily for 30     levothyroxine  (SYNTHROID ) 75 MCG tablet Synthroid  (Patient taking differently: 88 mcg.)     liothyronine (CYTOMEL) 5 MCG tablet Take 10 mcg by mouth every morning.     Multiple Vitamin (MULTI-VITAMINS PO) Take  by mouth daily.     No current facility-administered medications on file prior to visit.   Social History   Socioeconomic History   Marital status: Married    Spouse name: Not on file   Number of children: 3   Years of education: Not on file   Highest education level: Not on file  Occupational History   Occupation: Realtor  Tobacco Use   Smoking status: Never   Smokeless tobacco: Never  Vaping Use   Vaping status: Never Used  Substance and Sexual Activity   Alcohol use: No    Alcohol/week: 0.0 standard drinks of alcohol   Drug use: No   Sexual activity: Yes    Partners: Male    Birth control/protection: Other-see comments    Comment: Husband - vasectomy  Other Topics Concern   Not on file  Social History Narrative   Not on file   Social Drivers of Health   Financial Resource Strain: Low Risk  (01/25/2022)   Received from [facility]   Overall Financial Resource Strain (CARDIA)    Difficulty of Paying Living Expenses: Not hard at all  Food Insecurity: No Food Insecurity (01/25/2022)   Received from [facility]   Hunger Vital Sign    Within the past 12 months, you worried that your food would run out before you got the money to buy more.: Never true    Within the past 12 months, the food you bought just didn't last and you didn't have money to get more.: Never true  Transportation Needs: Not on file  Physical Activity: Insufficiently Active (01/25/2022)   Received from [facility]   Exercise Vital Sign    On average, how many days per week do you engage in moderate to strenuous exercise (like a brisk walk)?: 1 day    On average, how many minutes do you engage in exercise at this level?: 30 min  Stress: No Stress Concern Present (01/25/2022)   Received from [facility] - Occupational Stress Questionnaire    Feeling of Stress : Not at all  Social Connections: Unknown (02/22/2023)   Received from [facility]   Social  Network    Social Network: Not on file  Intimate Partner Violence: Unknown (02/22/2023)   Received from [facility]   HITS    Physically Hurt: Not on file    Insult or Talk Down To: Not on file    Threaten Physical Harm: Not on file    Scream or Curse: Not on file   Family History  Problem Relation Age of Onset   Diabetes Mother    Colon polyps Father    Heart disease Father 45   Stroke Father    Breast cancer Maternal Grandmother 26   Stroke Maternal Grandfather    Other Paternal Grandmother  housefire   Breast cancer Maternal Aunt 30   Cancer Paternal Aunt 65       NOS   Colon cancer Neg Hx    Ovarian cancer Neg Hx    Uterine cancer Neg Hx    Bladder Cancer Neg Hx    Allergies  Allergen Reactions   Gluten Meal Other (See Comments)    Abdominal pain   Lactose Intolerance (Gi)      PE Today's Vitals   03/03/24 1146  BP: 98/62  Pulse: 69  Temp: (!) 97.5 F (36.4 C)  TempSrc: Oral  SpO2: 98%  Weight: 121 lb (54.9 kg)  Height: 5' 3.5 (1.613 m)   Body mass index is 21.1 kg/m.  Physical Exam Vitals reviewed. Exam conducted with a chaperone present.  Constitutional:      General: She is not in acute distress.    Appearance: Normal appearance.  HENT:     Head: Normocephalic and atraumatic.     Nose: Nose normal.  Eyes:     Extraocular Movements: Extraocular movements intact.     Conjunctiva/sclera: Conjunctivae normal.  Neck:     Thyroid : No thyroid  mass, thyromegaly or thyroid  tenderness.  Pulmonary:     Effort: Pulmonary effort is normal.  Chest:     Chest wall: No mass or tenderness.  Breasts:    Right: Normal. No swelling, mass, nipple discharge, skin change or tenderness.     Left: Normal. No swelling, mass, nipple discharge, skin change or tenderness.       Comments: +implants Abdominal:     General: There is no distension.     Palpations: Abdomen is soft.     Tenderness: There is no abdominal tenderness.  Genitourinary:     General: Normal vulva.     Exam position: Lithotomy position.     Urethra: No prolapse.     Vagina: Normal. No vaginal discharge or bleeding.     Cervix: Normal. No lesion.     Uterus: Normal. Not enlarged and not tender.      Adnexa: Right adnexa normal and left adnexa normal.  Musculoskeletal:        General: Normal range of motion.     Cervical back: Normal range of motion.  Lymphadenopathy:     Upper Body:     Right upper body: No axillary adenopathy.     Left upper body: No axillary adenopathy.     Lower Body: No right inguinal adenopathy. No left inguinal adenopathy.  Skin:    General: Skin is warm and dry.  Neurological:     General: No focal deficit present.     Mental Status: She is alert.  Psychiatric:        Mood and Affect: Mood normal.        Behavior: Behavior normal.      Assessment and Plan:        Well woman exam with routine gynecological exam Assessment & Plan: Cervical cancer screening performed according to ASCCP guidelines. Encouraged annual mammogram screening Colonoscopy UTD DXA N/A Labs and immunizations with her primary Encouraged safe sexual practices as indicated Encouraged healthy lifestyle practices with diet and exercise For patients under 50-70yo, I recommend 1200mg  calcium daily and 600IU of vitamin D  daily.    Genitourinary syndrome of menopause -     Estradiol ; Insert 0.5g into vagina nightly for 2 weeks then 2 times a week.  Dispense: 42.5 g; Refill: 1  Negative depression screening  Vasomotor symptoms due to menopause  Would recommend veozah over oxybutynin given side effect profile However, discussed veozah, paxil, gabapentin , oxybutynin and clonidine Patient wants to continue PV Estrace  and follow-up if needed for hot flashes  Vera LULLA Pa, MD

## 2024-03-03 ENCOUNTER — Encounter: Payer: Self-pay | Admitting: Obstetrics and Gynecology

## 2024-03-03 ENCOUNTER — Ambulatory Visit: Admitting: Obstetrics and Gynecology

## 2024-03-03 VITALS — BP 98/62 | HR 69 | Temp 97.5°F | Ht 63.5 in | Wt 121.0 lb

## 2024-03-03 DIAGNOSIS — Z1331 Encounter for screening for depression: Secondary | ICD-10-CM | POA: Diagnosis not present

## 2024-03-03 DIAGNOSIS — N958 Other specified menopausal and perimenopausal disorders: Secondary | ICD-10-CM | POA: Insufficient documentation

## 2024-03-03 DIAGNOSIS — Z01419 Encounter for gynecological examination (general) (routine) without abnormal findings: Secondary | ICD-10-CM | POA: Insufficient documentation

## 2024-03-03 DIAGNOSIS — N951 Menopausal and female climacteric states: Secondary | ICD-10-CM | POA: Insufficient documentation

## 2024-03-03 MED ORDER — ESTRADIOL 0.01 % VA CREA: TOPICAL_CREAM | VAGINAL | 1 refills | Status: AC

## 2024-03-03 NOTE — Patient Instructions (Signed)

## 2024-03-03 NOTE — Assessment & Plan Note (Signed)
 Cervical cancer screening performed according to ASCCP guidelines. Encouraged annual mammogram screening Colonoscopy UTD DXA N/A Labs and immunizations with her primary Encouraged safe sexual practices as indicated Encouraged healthy lifestyle practices with diet and exercise For patients under 50-62yo, I recommend 1200mg  calcium daily and 600IU of vitamin D daily.

## 2025-03-04 ENCOUNTER — Ambulatory Visit: Admitting: Obstetrics and Gynecology
